# Patient Record
Sex: Male | Born: 1950
Health system: Southern US, Community
[De-identification: ages and names within clinical notes are randomized; demographics above are authoritative.]

## PROBLEM LIST (undated history)

## (undated) ENCOUNTER — Emergency Department (HOSPITAL_COMMUNITY): Payer: 59 | Source: Home / Self Care

## (undated) DIAGNOSIS — Z860100 Personal history of colon polyps, unspecified: Secondary | ICD-10-CM

## (undated) DIAGNOSIS — T7840XA Allergy, unspecified, initial encounter: Secondary | ICD-10-CM

## (undated) DIAGNOSIS — E785 Hyperlipidemia, unspecified: Secondary | ICD-10-CM

## (undated) DIAGNOSIS — I10 Essential (primary) hypertension: Secondary | ICD-10-CM

## (undated) HISTORY — PX: TONSILLECTOMY: SUR1361

## (undated) HISTORY — DX: Essential (primary) hypertension: I10

## (undated) HISTORY — PX: COLONOSCOPY: SHX174

## (undated) HISTORY — DX: Personal history of colon polyps, unspecified: Z86.0100

## (undated) HISTORY — DX: Hyperlipidemia, unspecified: E78.5

## (undated) HISTORY — PX: VASECTOMY: SHX75

## (undated) HISTORY — DX: Personal history of colonic polyps: Z86.010

## (undated) HISTORY — DX: Allergy, unspecified, initial encounter: T78.40XA

---

## 2004-06-08 ENCOUNTER — Ambulatory Visit: Payer: Self-pay | Admitting: Family Medicine

## 2005-06-14 ENCOUNTER — Ambulatory Visit: Payer: Self-pay | Admitting: Family Medicine

## 2005-06-21 ENCOUNTER — Ambulatory Visit: Payer: Self-pay | Admitting: Family Medicine

## 2006-02-02 ENCOUNTER — Ambulatory Visit: Payer: Self-pay | Admitting: Family Medicine

## 2006-07-27 ENCOUNTER — Ambulatory Visit: Payer: Self-pay | Admitting: Family Medicine

## 2006-07-27 LAB — CONVERTED CEMR LAB
ALT: 44 units/L — ABNORMAL HIGH (ref 0–40)
Albumin: 4.3 g/dL (ref 3.5–5.2)
Alkaline Phosphatase: 53 units/L (ref 39–117)
BUN: 7 mg/dL (ref 6–23)
Basophils Absolute: 0.1 10*3/uL (ref 0.0–0.1)
Basophils Relative: 0.9 % (ref 0.0–1.0)
CO2: 34 meq/L — ABNORMAL HIGH (ref 19–32)
Calcium: 9.1 mg/dL (ref 8.4–10.5)
Cholesterol: 168 mg/dL (ref 0–200)
GFR calc Af Amer: 130 mL/min
GFR calc non Af Amer: 107 mL/min
HDL: 33.6 mg/dL — ABNORMAL LOW (ref >39.0)
LDL Cholesterol: 104 mg/dL — ABNORMAL HIGH (ref 0–99)
Lymphocytes Relative: 22.8 % (ref 12.0–46.0)
MCHC: 34.8 g/dL (ref 30.0–36.0)
Monocytes Relative: 8.9 % (ref 3.0–11.0)
Neutro Abs: 4.9 10*3/uL (ref 1.4–7.7)
Platelets: 240 10*3/uL (ref 150–400)
Potassium: 4.2 meq/L (ref 3.5–5.1)
TSH: 2.12 microintl units/mL (ref 0.35–5.50)
Total Protein: 6.7 g/dL (ref 6.0–8.3)
Triglycerides: 150 mg/dL — ABNORMAL HIGH (ref 0–149)
VLDL: 30 mg/dL (ref 0–40)

## 2006-08-03 ENCOUNTER — Ambulatory Visit: Payer: Self-pay | Admitting: Family Medicine

## 2007-03-22 ENCOUNTER — Telehealth: Payer: Self-pay | Admitting: Family Medicine

## 2007-03-22 ENCOUNTER — Encounter: Payer: Self-pay | Admitting: Family Medicine

## 2007-12-04 ENCOUNTER — Telehealth: Payer: Self-pay | Admitting: Family Medicine

## 2008-01-03 ENCOUNTER — Ambulatory Visit: Payer: Self-pay | Admitting: Family Medicine

## 2008-01-03 LAB — CONVERTED CEMR LAB
AST: 27 units/L (ref 0–37)
Albumin: 4.4 g/dL (ref 3.5–5.2)
BUN: 8 mg/dL (ref 6–23)
Basophils Absolute: 0 10*3/uL (ref 0.0–0.1)
Basophils Relative: 0.4 % (ref 0.0–3.0)
Chloride: 105 meq/L (ref 96–112)
Cholesterol: 165 mg/dL (ref 0–200)
Creatinine, Ser: 1 mg/dL (ref 0.4–1.5)
Eosinophils Absolute: 0.2 10*3/uL (ref 0.0–0.7)
Eosinophils Relative: 2.9 % (ref 0.0–5.0)
GFR calc Af Amer: 100 mL/min
GFR calc non Af Amer: 82 mL/min
Glucose, Urine, Semiquant: NEGATIVE
HCT: 45 % (ref 39.0–52.0)
HDL: 37.9 mg/dL — ABNORMAL LOW (ref >39.0)
Ketones, urine, test strip: NEGATIVE
MCHC: 35.6 g/dL (ref 30.0–36.0)
MCV: 88.8 fL (ref 78.0–100.0)
Monocytes Absolute: 0.7 10*3/uL (ref 0.1–1.0)
Neutrophils Relative %: 62.8 % (ref 43.0–77.0)
PSA: 7.01 ng/mL — ABNORMAL HIGH (ref 0.10–4.00)
Platelets: 225 10*3/uL (ref 150–400)
Potassium: 3.4 meq/L — ABNORMAL LOW (ref 3.5–5.1)
Specific Gravity, Urine: 1.025
Total Bilirubin: 1 mg/dL (ref 0.3–1.2)
Triglycerides: 72 mg/dL (ref 0–149)
VLDL: 14 mg/dL (ref 0–40)
WBC: 7.8 10*3/uL (ref 4.5–10.5)
pH: 6

## 2008-01-09 ENCOUNTER — Encounter: Payer: Self-pay | Admitting: Family Medicine

## 2008-01-10 ENCOUNTER — Ambulatory Visit: Payer: Self-pay | Admitting: Family Medicine

## 2008-01-10 DIAGNOSIS — Z860100 Personal history of colon polyps, unspecified: Secondary | ICD-10-CM | POA: Insufficient documentation

## 2008-01-10 DIAGNOSIS — E785 Hyperlipidemia, unspecified: Secondary | ICD-10-CM

## 2008-01-18 ENCOUNTER — Ambulatory Visit: Payer: Self-pay | Admitting: Internal Medicine

## 2008-02-16 ENCOUNTER — Ambulatory Visit: Payer: Self-pay | Admitting: Family Medicine

## 2008-02-16 LAB — CONVERTED CEMR LAB
PSA, Free Pct: 19 — ABNORMAL LOW (ref >25)
PSA, Free: 0.4 ng/mL
PSA: 2.08 ng/mL (ref 0.10–4.00)

## 2008-02-17 ENCOUNTER — Telehealth: Payer: Self-pay | Admitting: Family Medicine

## 2008-06-27 ENCOUNTER — Ambulatory Visit: Payer: Self-pay | Admitting: Internal Medicine

## 2008-07-09 ENCOUNTER — Ambulatory Visit: Payer: Self-pay | Admitting: Internal Medicine

## 2008-07-09 LAB — HM COLONOSCOPY

## 2009-01-21 ENCOUNTER — Telehealth: Payer: Self-pay | Admitting: Family Medicine

## 2009-03-21 ENCOUNTER — Telehealth: Payer: Self-pay | Admitting: Family Medicine

## 2009-04-07 ENCOUNTER — Telehealth: Payer: Self-pay | Admitting: Family Medicine

## 2009-04-17 ENCOUNTER — Ambulatory Visit: Payer: Self-pay | Admitting: Family Medicine

## 2009-04-17 LAB — CONVERTED CEMR LAB
ALT: 28 units/L (ref 0–53)
Albumin: 4.4 g/dL (ref 3.5–5.2)
Alkaline Phosphatase: 55 units/L (ref 39–117)
Basophils Relative: 0.9 % (ref 0.0–3.0)
Bilirubin, Direct: 0.1 mg/dL (ref 0.0–0.3)
CO2: 31 meq/L (ref 19–32)
Chloride: 107 meq/L (ref 96–112)
Creatinine, Ser: 1.1 mg/dL (ref 0.4–1.5)
Eosinophils Relative: 2.6 % (ref 0.0–5.0)
HCT: 46.2 % (ref 39.0–52.0)
Hemoglobin: 14.9 g/dL (ref 13.0–17.0)
LDL Cholesterol: 98 mg/dL (ref 0–99)
MCV: 92.5 fL (ref 78.0–100.0)
Monocytes Absolute: 0.8 10*3/uL (ref 0.1–1.0)
Neutro Abs: 4.7 10*3/uL (ref 1.4–7.7)
Neutrophils Relative %: 64.3 % (ref 43.0–77.0)
Nitrite: NEGATIVE
PSA: 1.17 ng/mL (ref 0.10–4.00)
Potassium: 4.4 meq/L (ref 3.5–5.1)
RBC: 5 M/uL (ref 4.22–5.81)
Sodium: 142 meq/L (ref 135–145)
Specific Gravity, Urine: 1.02
Total CHOL/HDL Ratio: 3
Total Protein: 6.5 g/dL (ref 6.0–8.3)
Urobilinogen, UA: 0.2
WBC Urine, dipstick: NEGATIVE
WBC: 7.4 10*3/uL (ref 4.5–10.5)

## 2009-04-24 ENCOUNTER — Ambulatory Visit: Payer: Self-pay | Admitting: Family Medicine

## 2010-04-14 NOTE — Progress Notes (Signed)
Summary: refill  Phone Note Refill Request Message from:  Fax from Pharmacy  Refills Requested: Medication #1:  LIPITOR 20 MG TABS Take 1 tablet by mouth every night.   Last Refilled: 02/22/2009 [facility] ph----[phone]       fax-----[phone]  Initial call taken by: Warnell Forester,  March 21, 2009 8:48 AM    Prescriptions: LIPITOR 20 MG TABS (ATORVASTATIN CALCIUM) Take 1 tablet by mouth every night  #15 x 0   Entered by:   Kern Reap CMA (AAMA)   Authorized by:   Roderick Pee MD   Signed by:   Kern Reap CMA (AAMA) on 03/21/2009   Method used:   Electronically to        [facility]* (retail)       [address]       [place], [place]  [id number]       Ph: [phone]       Fax: [phone]   RxID:   [id number]

## 2010-04-14 NOTE — Progress Notes (Signed)
Summary: refill  Phone Note Refill Request Message from:  Fax from Pharmacy  Refills Requested: Medication #1:  LIPITOR 20 MG TABS Take 1 tablet by mouth every night. [place] [phone]    fax---[phone]  Initial call taken by: Warnell Forester,  April 07, 2009 9:52 AM  Follow-up for Phone Call        denied time for an office visit Follow-up by: Kern Reap CMA Duncan Dull),  April 07, 2009 11:48 AM  Additional Follow-up for Phone Call Additional follow up Details #1::        fax was sent back to pharmacy as denied-ov needed. Additional Follow-up by: Warnell Forester,  April 07, 2009 12:43 PM     Appended Document: refill Jason Gould,  please call Jason Gould tell him we will call him in 30 days of medication, but to make an appointment for a 30 minute physical sometime in the next 4 weeks because he is due.

## 2010-04-14 NOTE — Progress Notes (Signed)
Summary: lipitor refill  Phone Note Refill Request Message from:  Fax from Pharmacy on April 07, 2009 5:24 PM  Refills Requested: Medication #1:  LIPITOR 20 MG TABS Take 1 tablet by mouth every night. Initial call taken by: Kern Reap CMA Duncan Dull),  April 07, 2009 5:25 PM    Prescriptions: LIPITOR 20 MG TABS (ATORVASTATIN CALCIUM) Take 1 tablet by mouth every night  #30 x 0   Entered by:   Kern Reap CMA (AAMA)   Authorized by:   Roderick Pee MD   Signed by:   Kern Reap CMA (AAMA) on 04/07/2009   Method used:   Electronically to        [facility]* (retail)       [address]       [place], [place]  [id number]       Ph: [phone]       Fax: [phone]   RxID:   [id number]

## 2010-04-14 NOTE — Assessment & Plan Note (Signed)
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  He takes Lipitor 20 mg nightly total cholesterol 159, LDL 99, HDL 45.  LFTs normal.  Review of systems negative  Allergies (verified): No Known Drug Allergies  Past History:  Past medical, surgical, family and social histories (including risk factors) reviewed, and no changes noted (except as noted below).  Past Medical History: Reviewed history from 01/10/2008 and no changes required. Hyperlipidemia tonsillectomy concussion at age 48 years of age hospitalized.  No sequelae "V" Colonic polyps, hx of  Family History: Reviewed history from 01/10/2008 and no changes required.  father history of hypertension, hearing loss, hyperlipidemia mother history of colon cancer, hyperlipidemia two brothers, one in good health.  He has had colon resection for colon cancer two sisters in good health  Social History: Reviewed history from 02/16/2008 and no changes required. Occupation:  attn.for doctors !!!!!!!!!!!!!!!! Married Never Smoked Alcohol use-yes Drug use-no Regular exercise-yes  Physical Exam  General:  Well-developed,well-nourished,in no acute distress; alert,appropriate and cooperative throughout examination Head:  Normocephalic and atraumatic without obvious abnormalities. No apparent alopecia or balding. Eyes:  No corneal or conjunctival inflammation noted. EOMI. Perrla. Funduscopic exam benign, without hemorrhages, exudates or papilledema. Vision grossly normal. Ears:  External ear exam shows  no significant lesions or deformities.  Otoscopic examination reveals clear canals, tympanic membranes are intact bilaterally without bulging, retraction, inflammation or discharge. Hearing is grossly normal bilaterally. Nose:  External nasal examination shows no deformity or inflammation. Nasal mucosa are pink and moist without lesions or exudates. Mouth:  Oral mucosa and oropharynx without lesions or exudates.  Teeth in good repair. Neck:  No deformities, masses, or tenderness noted. Chest Wall:  No deformities, masses, tenderness or gynecomastia noted. Breasts:  No masses or gynecomastia noted Lungs:  Normal respiratory effort, chest expands symmetrically. Lungs are clear to auscultation, no crackles or wheezes. Heart:  Normal rate and regular rhythm. S1 and S2 normal without gallop, murmur, click, rub or other extra sounds. Abdomen:  Bowel sounds positive,abdomen soft and non-tender without masses, organomegaly or hernias noted. Rectal:  No external abnormalities noted. Normal sphincter tone. No rectal masses or tenderness. Genitalia:  Testes bilaterally descended without nodularity, tenderness or masses. No scrotal masses or lesions. No penis lesions or urethral discharge. Prostate:  Prostate gland firm and smooth, no enlargement, nodularity, tenderness, mass, asymmetry or induration. Msk:  No deformity or scoliosis noted of thoracic or lumbar spine.   Pulses:  R and L carotid,radial,femoral,dorsalis pedis and posterior tibial pulses are full and equal bilaterally Extremities:  No clubbing, cyanosis, edema, or deformity noted with normal full range of motion of all joints.   Neurologic:  No cranial nerve deficits noted. Station and gait are normal. Plantar reflexes are down-going bilaterally. DTRs are symmetrical throughout. Sensory, motor and coordinative functions appear intact. Skin:  Intact without suspicious lesions or rashes Cervical Nodes:  No lymphadenopathy noted Axillary Nodes:  No  palpable lymphadenopathy Inguinal Nodes:  No significant adenopathy Psych:  Cognition and judgment appear intact. Alert and cooperative with normal attention span and concentration. No apparent delusions, illusions, hallucinations   Impression & Recommendations:  Problem # 1:  HYPERLIPIDEMIA (ICD-272.4) Assessment Improved  His updated medication list for this problem includes:    Lipitor 20 Mg Tabs (Atorvastatin calcium) .Marland Kitchen... Take 1 tablet by mouth every night  Orders: Prescription Created Electronically [id number]) EKG w/ Interpretation (93000)  Problem # 2:  ELEVATED BP READING WITHOUT DX HYPERTENSION (ICD-796.2) Assessment: Unchanged  Orders: Prescription Created Electronically [id number]) EKG w/ Interpretation ([address])  Problem # 3:  COLONIC POLYPS, HX OF (ICD-V12.72) Assessment: Unchanged  Orders: Prescription Created Electronically [phone])  Complete Medication List: 1)  Lipitor 20 Mg Tabs (Atorvastatin calcium) .... Take 1 tablet by mouth every night  Patient Instructions: 1)  Please schedule a follow-up appointment in 1 year. 2)  Schedule a colonoscopy/sigmoidoscopy to help detect colon cancer. 3)  Take an Aspirin every day. Prescriptions: LIPITOR 20 MG TABS (ATORVASTATIN CALCIUM) Take 1 tablet by mouth every night  #100 x 3   Entered and Authorized by:   Roderick Pee MD   Signed by:   Roderick Pee MD on 04/24/2009   Method used:   Electronically to        [facility]* (retail)       [address]       [place], [place]  [id number]       Ph: [phone]       Fax: [phone]   RxID:   [id number] LIPITOR 20 MG TABS (ATORVASTATIN CALCIUM) Take 1 tablet by mouth every night  #100 x 3   Entered and Authorized by:   Roderick Pee MD   Signed by:   Roderick Pee MD on 04/24/2009   Method used:   Electronically to        [facility]* (retail)       [address]       [place], [place]  [id number]       Ph: [phone]        Fax: [phone]   RxID:   [id number]

## 2010-07-16 ENCOUNTER — Other Ambulatory Visit (INDEPENDENT_AMBULATORY_CARE_PROVIDER_SITE_OTHER): Payer: BC Managed Care – PPO

## 2010-07-16 DIAGNOSIS — Z0000 Encounter for general adult medical examination without abnormal findings: Secondary | ICD-10-CM

## 2010-07-16 LAB — POCT URINALYSIS DIPSTICK
Blood, UA: NEGATIVE
Glucose, UA: NEGATIVE
Nitrite, UA: NEGATIVE
Spec Grav, UA: 1.02
Urobilinogen, UA: 0.2
pH, UA: 6.5

## 2010-07-16 LAB — CBC WITH DIFFERENTIAL/PLATELET
Basophils Absolute: 0.1 10*3/uL (ref 0.0–0.1)
Eosinophils Absolute: 0.2 10*3/uL (ref 0.0–0.7)
Lymphocytes Relative: 24.5 % (ref 12.0–46.0)
MCHC: 34 g/dL (ref 30.0–36.0)
Neutrophils Relative %: 62.3 % (ref 43.0–77.0)
RDW: 13.5 % (ref 11.5–14.6)

## 2010-07-16 LAB — HEPATIC FUNCTION PANEL
Alkaline Phosphatase: 57 U/L (ref 39–117)
Bilirubin, Direct: 0.1 mg/dL (ref 0.0–0.3)

## 2010-07-16 LAB — BASIC METABOLIC PANEL
CO2: 26 mEq/L (ref 19–32)
Calcium: 8.8 mg/dL (ref 8.4–10.5)
Creatinine, Ser: 0.9 mg/dL (ref 0.4–1.5)
Glucose, Bld: 100 mg/dL — ABNORMAL HIGH (ref 70–99)

## 2010-07-16 LAB — LIPID PANEL
HDL: 40.4 mg/dL (ref >39.00)
Total CHOL/HDL Ratio: 4
Triglycerides: 84 mg/dL (ref 0.0–149.0)
VLDL: 16.8 mg/dL (ref 0.0–40.0)

## 2010-07-16 LAB — TSH: TSH: 1.96 u[IU]/mL (ref 0.35–5.50)

## 2010-07-17 ENCOUNTER — Other Ambulatory Visit: Payer: Self-pay | Admitting: Family Medicine

## 2010-07-21 ENCOUNTER — Encounter: Payer: Self-pay | Admitting: Family Medicine

## 2010-07-22 ENCOUNTER — Encounter: Payer: Self-pay | Admitting: Family Medicine

## 2010-07-22 ENCOUNTER — Ambulatory Visit (INDEPENDENT_AMBULATORY_CARE_PROVIDER_SITE_OTHER): Payer: BC Managed Care – PPO | Admitting: Family Medicine

## 2010-07-22 VITALS — BP 130/90 | Temp 98.3°F | Ht 72.0 in | Wt 203.0 lb

## 2010-07-22 DIAGNOSIS — E785 Hyperlipidemia, unspecified: Secondary | ICD-10-CM

## 2010-07-22 MED ORDER — ATORVASTATIN CALCIUM 20 MG PO TABS: 20.0000 mg | ORAL_TABLET | Freq: Every day | ORAL | Status: DC

## 2010-07-22 NOTE — Patient Instructions (Signed)
Continue your current medications.  I would recommend he see a plastic surgeon for removal of the lesion.  On your left upper 4 head, since it's increasing in size.  Return one year or sooner if any problem

## 2010-07-22 NOTE — Progress Notes (Signed)
  Subjective:    Patient ID: Jason Gould, male    DOB: May 01, 1950, 60 y.o.   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  He takes Lipitor 20 mg nightly along with a baby aspirin, Lipitor, goal with a total cholesterol of 167, HDL 40, and LDL of 110.  He also takes a baby aspirin.  He also has hearing loss.  He does have bilateral hearing aids.  He does have numerous seborrheic keratoses.  The one on his left upper forehead is increasing in size.  Wife Olegario Messier is doing well.  Children are well.  A daughter in [place], [place], is a Teacher, early years/pre, and a son, who recently graduated from college   Review of Systems  Constitutional: Negative.   HENT: Positive for hearing loss.   Eyes: Negative.   Respiratory: Negative.   Cardiovascular: Negative.   Gastrointestinal: Negative.   Genitourinary: Negative.   Musculoskeletal: Negative.   Skin: Negative.   Neurological: Negative.   Hematological: Negative.   Psychiatric/Behavioral: Negative.        Objective:   Physical Exam  Constitutional: He is oriented to person, place, and time. He appears well-developed and well-nourished.  HENT:  Head: Normocephalic and atraumatic.  Right Ear: External ear normal.  Left Ear: External ear normal.  Nose: Nose normal.  Mouth/Throat: Oropharynx is clear and moist.  Eyes: Conjunctivae and EOM are normal. Pupils are equal, round, and reactive to light.  Neck: Normal range of motion. Neck supple. No JVD present. No tracheal deviation present. No thyromegaly present.  Cardiovascular: Normal rate, regular rhythm, normal heart sounds and intact distal pulses.  Exam reveals no gallop and no friction rub.   No murmur heard. Pulmonary/Chest: Effort normal and breath sounds normal. No stridor. No respiratory distress. He has no wheezes. He has no rales. He exhibits no  tenderness.  Abdominal: Soft. Bowel sounds are normal. He exhibits no distension and no mass. There is no tenderness. There is no rebound and no guarding.  Genitourinary: Rectum normal, prostate normal and penis normal. Guaiac negative stool. No penile tenderness.  Musculoskeletal: Normal range of motion. He exhibits no edema and no tenderness.  Lymphadenopathy:    He has no cervical adenopathy.  Neurological: He is alert and oriented to person, place, and time. He has normal reflexes. No cranial nerve deficit. He exhibits normal muscle tone.  Skin: Skin is warm and dry. No rash noted. No erythema. No pallor.       Total body skin exam shows numerous freckles and moles.  Capillary hemangiomas seborrheic keratoses.  The one on his left forehead is increasing in size.  I would recommend that be removed.  He also has multiple skin tags around his upper and lower eyelids.  I would also recommend that these be removed.  Psychiatric: He has a normal mood and affect. His behavior is normal. Judgment and thought content normal.          Assessment & Plan:  Healthy male.  Hyperlipidemia,,,,,,,,,,, lipid signal continue current therapy.  Hearing loss.  Continue hearing aids.  Seborrheic keratoses left forehead, increasing in size.  Recommend plastic surgery consult for removal

## 2011-07-27 ENCOUNTER — Other Ambulatory Visit: Payer: Self-pay | Admitting: *Deleted

## 2011-07-27 DIAGNOSIS — E785 Hyperlipidemia, unspecified: Secondary | ICD-10-CM

## 2011-07-27 MED ORDER — ATORVASTATIN CALCIUM 20 MG PO TABS: 20.0000 mg | ORAL_TABLET | Freq: Every day | ORAL | Status: DC

## 2011-08-17 ENCOUNTER — Telehealth: Payer: Self-pay | Admitting: Family Medicine

## 2011-08-17 DIAGNOSIS — Z0000 Encounter for general adult medical examination without abnormal findings: Secondary | ICD-10-CM

## 2011-08-17 DIAGNOSIS — R030 Elevated blood-pressure reading, without diagnosis of hypertension: Secondary | ICD-10-CM

## 2011-08-17 DIAGNOSIS — E785 Hyperlipidemia, unspecified: Secondary | ICD-10-CM

## 2011-08-17 NOTE — Telephone Encounter (Signed)
Pt is going to [facility] 6.11 for cpx labs. Can you please put the orders in? Thanks.

## 2011-08-17 NOTE — Telephone Encounter (Signed)
Order sent.

## 2011-08-24 ENCOUNTER — Other Ambulatory Visit: Payer: BC Managed Care – PPO

## 2011-08-25 ENCOUNTER — Other Ambulatory Visit (INDEPENDENT_AMBULATORY_CARE_PROVIDER_SITE_OTHER): Payer: BC Managed Care – PPO

## 2011-08-25 DIAGNOSIS — R030 Elevated blood-pressure reading, without diagnosis of hypertension: Secondary | ICD-10-CM

## 2011-08-25 DIAGNOSIS — Z0000 Encounter for general adult medical examination without abnormal findings: Secondary | ICD-10-CM

## 2011-08-25 DIAGNOSIS — E785 Hyperlipidemia, unspecified: Secondary | ICD-10-CM

## 2011-08-25 LAB — LIPID PANEL
Cholesterol: 153 mg/dL (ref 0–200)
HDL: 39.9 mg/dL (ref >39.00)
LDL Cholesterol: 100 mg/dL — ABNORMAL HIGH (ref 0–99)
Triglycerides: 64 mg/dL (ref 0.0–149.0)
VLDL: 12.8 mg/dL (ref 0.0–40.0)

## 2011-08-25 LAB — HEPATIC FUNCTION PANEL
ALT: 29 U/L (ref 0–53)
AST: 25 U/L (ref 0–37)
Total Protein: 6.1 g/dL (ref 6.0–8.3)

## 2011-08-25 LAB — URINALYSIS
Bilirubin Urine: NEGATIVE
Ketones, ur: NEGATIVE
Urine Glucose: NEGATIVE
Urobilinogen, UA: 0.2 (ref 0.0–1.0)

## 2011-08-25 LAB — CBC WITH DIFFERENTIAL/PLATELET
Basophils Absolute: 0.1 10*3/uL (ref 0.0–0.1)
Eosinophils Absolute: 0.2 10*3/uL (ref 0.0–0.7)
Lymphocytes Relative: 19.9 % (ref 12.0–46.0)
Lymphs Abs: 1.6 10*3/uL (ref 0.7–4.0)
Monocytes Relative: 8.4 % (ref 3.0–12.0)
Platelets: 231 10*3/uL (ref 150.0–400.0)
RDW: 13 % (ref 11.5–14.6)

## 2011-08-25 LAB — BASIC METABOLIC PANEL
BUN: 11 mg/dL (ref 6–23)
Chloride: 103 mEq/L (ref 96–112)
Potassium: 3.9 mEq/L (ref 3.5–5.1)
Sodium: 140 mEq/L (ref 135–145)

## 2011-08-25 LAB — TSH: TSH: 1.74 u[IU]/mL (ref 0.35–5.50)

## 2011-08-25 LAB — PSA: PSA: 1.75 ng/mL (ref 0.10–4.00)

## 2011-08-26 ENCOUNTER — Encounter: Payer: Self-pay | Admitting: Family Medicine

## 2011-08-30 ENCOUNTER — Ambulatory Visit (INDEPENDENT_AMBULATORY_CARE_PROVIDER_SITE_OTHER): Payer: BC Managed Care – PPO | Admitting: Family Medicine

## 2011-08-30 ENCOUNTER — Encounter: Payer: Self-pay | Admitting: Family Medicine

## 2011-08-30 VITALS — BP 110/80 | Temp 98.3°F | Ht 73.5 in | Wt 206.0 lb

## 2011-08-30 DIAGNOSIS — E785 Hyperlipidemia, unspecified: Secondary | ICD-10-CM

## 2011-08-30 DIAGNOSIS — Z0000 Encounter for general adult medical examination without abnormal findings: Secondary | ICD-10-CM

## 2011-08-30 MED ORDER — ATORVASTATIN CALCIUM 20 MG PO TABS: 20.0000 mg | ORAL_TABLET | Freq: Every day | ORAL | Status: DC

## 2011-08-30 NOTE — Patient Instructions (Addendum)
Continue your current medications  Followup in 1 year sooner if any problems  GI we'll contact you when it's time for followup colonoscopy  Remember also taken aspirin tablet daily if you're not already

## 2011-08-30 NOTE — Progress Notes (Signed)
  Subjective:    Patient ID: Jason Gould, male    DOB: [birth year], 61 y.o.   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   HENT: Negative.   Eyes: Negative.   Respiratory: Negative.   Cardiovascular: Negative.   Gastrointestinal: Negative.   Genitourinary: Negative.   Musculoskeletal: Negative.   Skin: Negative.   Neurological: Negative.   Hematological: Negative.   Psychiatric/Behavioral: Negative.        Objective:   Physical Exam  Constitutional: He is oriented to person, place, and time. He appears well-developed and well-nourished.  HENT:  Head: Normocephalic and atraumatic.  Right Ear: External ear normal.  Left Ear: External ear normal.  Nose: Nose normal.  Mouth/Throat: Oropharynx is clear and moist.  Eyes: Conjunctivae and EOM are normal. Pupils are equal, round, and reactive to light.  Neck: Normal range of motion. Neck supple. No JVD present. No tracheal deviation present. No thyromegaly present.  Cardiovascular: Normal rate, regular rhythm, normal heart sounds and intact distal pulses.  Exam reveals no gallop and no friction rub.   No murmur heard. Pulmonary/Chest: Effort normal and breath sounds normal. No stridor. No respiratory distress. He has no wheezes. He has no rales. He exhibits no tenderness.  Abdominal: Soft. Bowel sounds are normal. He exhibits no distension and no mass. There is no tenderness. There is no rebound and no guarding.  Genitourinary: Rectum normal, prostate normal and penis normal. Guaiac negative stool. No penile tenderness.  Musculoskeletal: Normal range of motion. He exhibits no edema and no tenderness.  Lymphadenopathy:    He has no cervical adenopathy.  Neurological: He is  alert and oriented to person, place, and time. He has normal reflexes. No cranial nerve deficit. He exhibits normal muscle tone.  Skin: Skin is warm and dry. No rash noted. No erythema. No pallor.  Psychiatric: He has a normal mood and affect. His behavior is normal. Judgment and thought content normal.          Assessment & Plan:  Healthy male  Hyperlipidemia continue Lipitor 20 mg daily and an aspirin tablet  History of colon polyps followup in GI as outlined

## 2012-05-09 ENCOUNTER — Ambulatory Visit (INDEPENDENT_AMBULATORY_CARE_PROVIDER_SITE_OTHER): Payer: BC Managed Care – PPO | Admitting: Family Medicine

## 2012-05-09 ENCOUNTER — Encounter: Payer: Self-pay | Admitting: Family Medicine

## 2012-05-09 VITALS — BP 150/90 | Temp 98.6°F | Wt 208.0 lb

## 2012-05-09 DIAGNOSIS — I10 Essential (primary) hypertension: Secondary | ICD-10-CM

## 2012-05-09 LAB — POCT URINALYSIS DIPSTICK
Bilirubin, UA: NEGATIVE
Glucose, UA: NEGATIVE
Leukocytes, UA: NEGATIVE
Nitrite, UA: NEGATIVE
Urobilinogen, UA: 0.2

## 2012-05-09 MED ORDER — ATENOLOL-CHLORTHALIDONE 50-25 MG PO TABS: 1.0000 | ORAL_TABLET | Freq: Every day | ORAL | Status: DC

## 2012-05-09 NOTE — Patient Instructions (Signed)
20 minutes of exercise daily  Salt free diet  Tenoretic 50-25,,,,,,,, we'll start with a half a tablet a day in the morning  Check your blood pressure daily in the morning,,,,,,, the metastatic risk device I seen is the digital pump up blood pressure cuff ..........     [employer]  Return in one month for followup with a record of volume blood pressure readings and the device  Basic labs today

## 2012-05-09 NOTE — Progress Notes (Signed)
  Subjective:    Patient ID: Jason Gould, male    DOB: [birth year], 62 y.o.   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 For the past 5 nights he's taking 100 mg of Toprol at bedtime which his wife had. BP today 150/90.  It significant that both his mother and his father have a history of hypertension mother set a pacemaker for bradycardia  2 sisters both in good health 2 brothers in good health once a rheumatologist in [place] none of his siblings have hypertension that he knows of.   Review of Systems Review of systems otherwise negative    Objective:   Physical Exam  Well-developed well-nourished male no acute distress BP right arm sitting position 150/100 same left pulse 70 and regular  Cardiac exam normal pulmonary exam normal no carotid or abdominal bruits      Assessment & Plan:  New-onset hypertension plan,,,,,,,,, exercise program, salt free diet, Tenoretic one half tab daily BP check daily followup in 4-6 weeks labs today

## 2012-05-10 LAB — BASIC METABOLIC PANEL
Chloride: 107 mEq/L (ref 96–112)
GFR: 63.77 mL/min (ref >60.00)
Potassium: 3.8 mEq/L (ref 3.5–5.1)
Sodium: 141 mEq/L (ref 135–145)

## 2012-05-10 LAB — CBC WITH DIFFERENTIAL/PLATELET
Basophils Absolute: 0.1 10*3/uL (ref 0.0–0.1)
Hemoglobin: 14.8 g/dL (ref 13.0–17.0)
Lymphocytes Relative: 23.8 % (ref 12.0–46.0)
Monocytes Relative: 7.7 % (ref 3.0–12.0)
Neutro Abs: 5.4 10*3/uL (ref 1.4–7.7)
Neutrophils Relative %: 64.8 % (ref 43.0–77.0)
RBC: 4.97 Mil/uL (ref 4.22–5.81)
RDW: 13.2 % (ref 11.5–14.6)

## 2012-06-06 ENCOUNTER — Ambulatory Visit: Payer: BC Managed Care – PPO | Admitting: Family Medicine

## 2012-07-11 ENCOUNTER — Ambulatory Visit (INDEPENDENT_AMBULATORY_CARE_PROVIDER_SITE_OTHER): Payer: BC Managed Care – PPO | Admitting: Family Medicine

## 2012-07-11 ENCOUNTER — Encounter: Payer: Self-pay | Admitting: Family Medicine

## 2012-07-11 VITALS — BP 140/80 | Temp 98.3°F | Wt 202.0 lb

## 2012-07-11 DIAGNOSIS — I10 Essential (primary) hypertension: Secondary | ICD-10-CM

## 2012-07-11 MED ORDER — CHLORTHALIDONE 25 MG PO TABS: 25.0000 mg | ORAL_TABLET | Freq: Every day | ORAL | Status: DC

## 2012-07-11 NOTE — Progress Notes (Signed)
  Subjective:    Patient ID: KEKOA FYOCK, male    DOB: 1951-02-23, 62 y.o.   MRN: [id number]  HPI Melecio is a 62 year old married male nonsmoker who comes in today for followup of hypertension  We saw him a while back and start him on Tenoretic 50-25 however he didn't take his medication. He wanted to try diet and exercise and see if that will help. His weight is down to 202 pounds his blood pressure still runs high. Systolic range from 135-150 diastolic range from 85-90.  BP right arm sitting position with our cuff and his today by me BP 150/90   Review of Systems    review of systems negative Objective:   Physical Exam  Well-developed well-nourished male no acute distress BP right arm sitting position 150/90      Assessment & Plan:  Hypertension not at goal begin chlorthalidone 12.5 mg daily followup in one month

## 2012-07-11 NOTE — Patient Instructions (Signed)
Chlorthalidone 25 mg............. One tablet daily in the morning  Check your blood pressure right arm sitting position daily in the morning  Return in one month sooner if any problems

## 2012-08-25 ENCOUNTER — Other Ambulatory Visit: Payer: BC Managed Care – PPO

## 2012-08-30 ENCOUNTER — Encounter: Payer: BC Managed Care – PPO | Admitting: Family Medicine

## 2012-08-31 ENCOUNTER — Other Ambulatory Visit (INDEPENDENT_AMBULATORY_CARE_PROVIDER_SITE_OTHER): Payer: BC Managed Care – PPO

## 2012-08-31 DIAGNOSIS — Z0000 Encounter for general adult medical examination without abnormal findings: Secondary | ICD-10-CM

## 2012-08-31 LAB — BASIC METABOLIC PANEL
CO2: 30 mEq/L (ref 19–32)
Chloride: 103 mEq/L (ref 96–112)
Glucose, Bld: 108 mg/dL — ABNORMAL HIGH (ref 70–99)
Potassium: 4 mEq/L (ref 3.5–5.1)
Sodium: 140 mEq/L (ref 135–145)

## 2012-08-31 LAB — POCT URINALYSIS DIPSTICK
Glucose, UA: NEGATIVE
Leukocytes, UA: NEGATIVE
Spec Grav, UA: 1.015
Urobilinogen, UA: 0.2

## 2012-08-31 LAB — CBC WITH DIFFERENTIAL/PLATELET
Basophils Absolute: 0.1 10*3/uL (ref 0.0–0.1)
Basophils Relative: 0.8 % (ref 0.0–3.0)
Eosinophils Absolute: 0.2 10*3/uL (ref 0.0–0.7)
HCT: 40.7 % (ref 39.0–52.0)
Hemoglobin: 13.7 g/dL (ref 13.0–17.0)
Lymphocytes Relative: 10.3 % — ABNORMAL LOW (ref 12.0–46.0)
Lymphs Abs: 1.1 10*3/uL (ref 0.7–4.0)
MCHC: 33.7 g/dL (ref 30.0–36.0)
Neutro Abs: 8.4 10*3/uL — ABNORMAL HIGH (ref 1.4–7.7)
RBC: 4.45 Mil/uL (ref 4.22–5.81)
RDW: 13.2 % (ref 11.5–14.6)

## 2012-08-31 LAB — HEPATIC FUNCTION PANEL
AST: 23 U/L (ref 0–37)
Alkaline Phosphatase: 59 U/L (ref 39–117)
Total Bilirubin: 0.9 mg/dL (ref 0.3–1.2)

## 2012-08-31 LAB — LIPID PANEL
Cholesterol: 165 mg/dL (ref 0–200)
LDL Cholesterol: 101 mg/dL — ABNORMAL HIGH (ref 0–99)
Triglycerides: 124 mg/dL (ref 0.0–149.0)
VLDL: 24.8 mg/dL (ref 0.0–40.0)

## 2012-09-05 ENCOUNTER — Ambulatory Visit (INDEPENDENT_AMBULATORY_CARE_PROVIDER_SITE_OTHER): Payer: BC Managed Care – PPO | Admitting: Family Medicine

## 2012-09-05 ENCOUNTER — Encounter: Payer: Self-pay | Admitting: Family Medicine

## 2012-09-05 VITALS — BP 120/74 | Temp 99.0°F | Ht 72.0 in | Wt 204.0 lb

## 2012-09-05 DIAGNOSIS — Z86010 Personal history of colonic polyps: Secondary | ICD-10-CM

## 2012-09-05 DIAGNOSIS — E785 Hyperlipidemia, unspecified: Secondary | ICD-10-CM

## 2012-09-05 DIAGNOSIS — I10 Essential (primary) hypertension: Secondary | ICD-10-CM

## 2012-09-05 MED ORDER — METOPROLOL SUCCINATE ER 50 MG PO TB24: 50.0000 mg | ORAL_TABLET | Freq: Every day | ORAL | Status: DC

## 2012-09-05 MED ORDER — CHLORTHALIDONE 25 MG PO TABS: 25.0000 mg | ORAL_TABLET | Freq: Every day | ORAL | Status: DC

## 2012-09-05 NOTE — Progress Notes (Signed)
Subjective:    Patient ID: Jason Gould, male    DOB: [birth year], 62 y.o.   MRN: [id number]  HPI [name] is a 62 year old married male nonsmoker who comes in today for his annual physical examination because of a history of hypertension and hyperlipidemia  He currently takes Lipitor 20 mg daily for hyperlipidemia lipids are at goal with an LDL of 101  He also takes chlorthalidone 25 mg daily for hypertension. I had originally started him on a beta blocker Tenoretic however he wanted to see if the chlorthalidone alone would keep his blood pressure normal. It didn't so he took some of his wife's Toprol 50 mg and that combination has kept his blood pressure normal BP today 120/74. No side effects from the Toprol  He has had some soreness in his great toe but no episodes of severe pain redness or swelling. He may have a little gout and that toe. We discussed checking uric acid level since starting allopurinol we decided to hold off for now. The diuretic may precipitate a gout attack and he is aware of that  He gets routine eye care, dental care, colonoscopy is in GI because of a history colon polyps, tetanus booster 2009, also has bilateral hearing aids for high-frequency hearing loss   Review of Systems  Constitutional: Negative.   HENT: Negative.   Eyes: Negative.   Respiratory: Negative.   Cardiovascular: Negative.   Gastrointestinal: Negative.   Genitourinary: Negative.   Musculoskeletal: Negative.   Skin: Negative.   Neurological: Negative.   Psychiatric/Behavioral: Negative.        Objective:   Physical Exam  Nursing note and vitals reviewed. Constitutional: He is oriented to person, place, and time. He appears well-developed and well-nourished.  HENT:  Head: Normocephalic and atraumatic.  Right Ear: External ear normal.  Left Ear: External ear normal.  Nose: Nose normal.  Mouth/Throat: Oropharynx is clear and moist.  Eyes: Conjunctivae and EOM are normal. Pupils are equal,  round, and reactive to light.  Neck: Normal range of motion. Neck supple. No JVD present. No tracheal deviation present. No thyromegaly present.  Cardiovascular: Normal rate, regular rhythm, normal heart sounds and intact distal pulses.  Exam reveals no gallop and no friction rub.   No murmur heard. No carotid or aortic bruits peripheral pulses 2+ and symmetrical  Pulmonary/Chest: Effort normal and breath sounds normal. No stridor. No respiratory distress. He has no wheezes. He has no rales. He exhibits no tenderness.  Abdominal: Soft. Bowel sounds are normal. He exhibits no distension and no mass. There is no tenderness. There is no rebound and no guarding.  Genitourinary: Rectum normal, prostate normal and penis normal. Guaiac negative stool. No penile tenderness.  Musculoskeletal: Normal range of motion. He exhibits no edema and no tenderness.  Lymphadenopathy:    He has no cervical adenopathy.  Neurological: He is alert and oriented to person, place, and time. He has normal reflexes. No cranial nerve deficit. He exhibits normal muscle tone.  Skin: Skin is warm and dry. No rash noted. No erythema. No pallor.  Total body skin exam normal he has a garden variety of freckles moles capillaries meningioma skin tags seborrheic keratosis all of which appear to be benign  Psychiatric: He has a normal mood and affect. His behavior is normal. Judgment and thought content normal.          Assessment & Plan:  Healthy male  Hyperlipidemia goal continue Lipitor 20 mg daily  Hypertension at goal on chlorthalidone  25 mg and Toprol 50 mg daily.

## 2012-09-05 NOTE — Patient Instructions (Addendum)
Continue the chlorthalidone and Toprol............. Once your blood pressure is stable you might want to try to cut the Toprol in half  If you do that and be sure to do a BP check every morning for 3-4 weeks to be sure your blood pressure stays normal,,,,,,,,,,,,, 135/85 or less  Return in one year for general physical exam sooner if any problems  Continue the Lipitor and aspirin one of each daily

## 2012-11-05 ENCOUNTER — Other Ambulatory Visit: Payer: Self-pay | Admitting: Family Medicine

## 2013-05-31 ENCOUNTER — Encounter: Payer: Self-pay | Admitting: Internal Medicine

## 2013-10-15 ENCOUNTER — Other Ambulatory Visit: Payer: Self-pay | Admitting: Family Medicine

## 2013-11-17 ENCOUNTER — Other Ambulatory Visit: Payer: Self-pay | Admitting: Family Medicine

## 2013-11-26 ENCOUNTER — Other Ambulatory Visit: Payer: Self-pay | Admitting: Family Medicine

## 2013-11-28 ENCOUNTER — Telehealth: Payer: Self-pay | Admitting: Family Medicine

## 2013-11-28 DIAGNOSIS — I10 Essential (primary) hypertension: Secondary | ICD-10-CM

## 2013-11-28 MED ORDER — CHLORTHALIDONE 25 MG PO TABS: 25.0000 mg | ORAL_TABLET | Freq: Every day | ORAL | Status: DC

## 2013-11-28 NOTE — Telephone Encounter (Signed)
Pt has cpx sch for 12/13/13 and needs refill on chlorthalidone call into [facility]

## 2013-12-06 ENCOUNTER — Other Ambulatory Visit (INDEPENDENT_AMBULATORY_CARE_PROVIDER_SITE_OTHER): Payer: BC Managed Care – PPO

## 2013-12-06 ENCOUNTER — Other Ambulatory Visit: Payer: BC Managed Care – PPO

## 2013-12-06 DIAGNOSIS — Z0000 Encounter for general adult medical examination without abnormal findings: Secondary | ICD-10-CM

## 2013-12-06 LAB — CBC WITH DIFFERENTIAL/PLATELET
BASOS ABS: 0.1 10*3/uL (ref 0.0–0.1)
Basophils Relative: 1.3 % (ref 0.0–3.0)
EOS ABS: 0.2 10*3/uL (ref 0.0–0.7)
Eosinophils Relative: 2 % (ref 0.0–5.0)
HEMATOCRIT: 46.9 % (ref 39.0–52.0)
Hemoglobin: 15.8 g/dL (ref 13.0–17.0)
LYMPHS ABS: 1.9 10*3/uL (ref 0.7–4.0)
Lymphocytes Relative: 23.8 % (ref 12.0–46.0)
MCHC: 33.7 g/dL (ref 30.0–36.0)
MCV: 91.6 fl (ref 78.0–100.0)
Monocytes Absolute: 0.8 10*3/uL (ref 0.1–1.0)
Monocytes Relative: 10.2 % (ref 3.0–12.0)
Neutro Abs: 5.1 10*3/uL (ref 1.4–7.7)
Neutrophils Relative %: 62.7 % (ref 43.0–77.0)
PLATELETS: 237 10*3/uL (ref 150.0–400.0)
RBC: 5.12 Mil/uL (ref 4.22–5.81)
RDW: 13.2 % (ref 11.5–15.5)
WBC: 8.1 10*3/uL (ref 4.0–10.5)

## 2013-12-06 LAB — POCT URINALYSIS DIPSTICK
Bilirubin, UA: NEGATIVE
Blood, UA: NEGATIVE
Glucose, UA: NEGATIVE
Ketones, UA: NEGATIVE
Leukocytes, UA: NEGATIVE
NITRITE UA: NEGATIVE
PH UA: 7.5
Protein, UA: NEGATIVE
Spec Grav, UA: 1.01
Urobilinogen, UA: 0.2

## 2013-12-06 LAB — HEPATIC FUNCTION PANEL
ALT: 30 U/L (ref 0–53)
AST: 28 U/L (ref 0–37)
Albumin: 4.4 g/dL (ref 3.5–5.2)
Alkaline Phosphatase: 50 U/L (ref 39–117)
Bilirubin, Direct: 0.1 mg/dL (ref 0.0–0.3)
Total Bilirubin: 1.1 mg/dL (ref 0.2–1.2)
Total Protein: 6.7 g/dL (ref 6.0–8.3)

## 2013-12-06 LAB — LIPID PANEL
CHOLESTEROL: 179 mg/dL (ref 0–200)
HDL: 37.1 mg/dL — AB (ref >39.00)
LDL Cholesterol: 116 mg/dL — ABNORMAL HIGH (ref 0–99)
NonHDL: 141.9
TRIGLYCERIDES: 131 mg/dL (ref 0.0–149.0)
Total CHOL/HDL Ratio: 5
VLDL: 26.2 mg/dL (ref 0.0–40.0)

## 2013-12-06 LAB — BASIC METABOLIC PANEL
BUN: 14 mg/dL (ref 6–23)
CO2: 30 mEq/L (ref 19–32)
CREATININE: 1.1 mg/dL (ref 0.4–1.5)
Calcium: 9.6 mg/dL (ref 8.4–10.5)
Chloride: 100 mEq/L (ref 96–112)
GFR: 69.96 mL/min (ref >60.00)
Glucose, Bld: 110 mg/dL — ABNORMAL HIGH (ref 70–99)
POTASSIUM: 4.2 meq/L (ref 3.5–5.1)
Sodium: 139 mEq/L (ref 135–145)

## 2013-12-06 LAB — PSA: PSA: 2.11 ng/mL (ref 0.10–4.00)

## 2013-12-06 LAB — TSH: TSH: 2.1 u[IU]/mL (ref 0.35–4.50)

## 2013-12-07 ENCOUNTER — Other Ambulatory Visit: Payer: BC Managed Care – PPO

## 2013-12-13 ENCOUNTER — Encounter: Payer: Self-pay | Admitting: Family Medicine

## 2013-12-13 ENCOUNTER — Ambulatory Visit (INDEPENDENT_AMBULATORY_CARE_PROVIDER_SITE_OTHER): Payer: BC Managed Care – PPO | Admitting: Family Medicine

## 2013-12-13 VITALS — BP 110/72 | Temp 98.2°F | Ht 72.25 in | Wt 207.0 lb

## 2013-12-13 DIAGNOSIS — I10 Essential (primary) hypertension: Secondary | ICD-10-CM

## 2013-12-13 DIAGNOSIS — Z23 Encounter for immunization: Secondary | ICD-10-CM

## 2013-12-13 DIAGNOSIS — E785 Hyperlipidemia, unspecified: Secondary | ICD-10-CM

## 2013-12-13 DIAGNOSIS — Z0000 Encounter for general adult medical examination without abnormal findings: Secondary | ICD-10-CM

## 2013-12-13 MED ORDER — ATORVASTATIN CALCIUM 20 MG PO TABS: ORAL_TABLET | ORAL | Status: DC

## 2013-12-13 MED ORDER — CHLORTHALIDONE 25 MG PO TABS: ORAL_TABLET | ORAL | Status: DC

## 2013-12-13 MED ORDER — METOPROLOL SUCCINATE ER 50 MG PO TB24: ORAL_TABLET | ORAL | Status: DC

## 2013-12-13 NOTE — Progress Notes (Signed)
Pre visit review using our clinic review tool, if applicable. No additional management support is needed unless otherwise documented below in the visit note. 

## 2013-12-13 NOTE — Progress Notes (Signed)
   Subjective:    Patient ID: Jason Gould, male    DOB: 09/18/1950, 63 y.o.   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   HENT: Negative.   Eyes: Negative.   Respiratory: Negative.   Cardiovascular: Negative.   Gastrointestinal: Negative.   Genitourinary: Negative.   Musculoskeletal: Negative.   Skin: Negative.   Neurological: Negative.   Psychiatric/Behavioral: Negative.        Objective:   Physical Exam  Constitutional: He is oriented to person, place, and time. He appears well-developed and well-nourished.  HENT:  Head: Normocephalic and atraumatic.  Right Ear: External ear normal.  Left Ear: External ear normal.  Nose: Nose normal.  Mouth/Throat: Oropharynx is clear and moist.  Bilateral hearing loss........ he does wear his hearing aids but only sporadically  Eyes: Conjunctivae and EOM are normal. Pupils are equal, round, and reactive to light.  Neck: Normal range of motion. Neck supple. No JVD present. No tracheal deviation present. No thyromegaly present.  Cardiovascular: Normal rate, regular rhythm, normal heart sounds and intact distal pulses.  Exam reveals no gallop and no friction rub.   No murmur heard. Bruce pulses 2+ and symmetrical  Pulmonary/Chest: Effort normal and breath sounds normal. No stridor. No respiratory distress. He has no wheezes. He has no rales. He exhibits no tenderness.  Abdominal: Soft.  Bowel sounds are normal. He exhibits no distension and no mass. There is no tenderness. There is no rebound and no guarding.  Genitourinary: Rectum normal, prostate normal and penis normal. Guaiac negative stool. No penile tenderness.  Musculoskeletal: Normal range of motion. He exhibits no edema and no tenderness.  Lymphadenopathy:    He has no cervical adenopathy.  Neurological: He is alert and oriented to person, place, and time. He has normal reflexes. No cranial nerve deficit. He exhibits normal muscle tone.  Skin: Skin is warm and dry. No rash noted. No erythema. No pallor.  Total body skin exam normal except for scar mid upper abdomen midline previous mole removed  Psychiatric: He has a normal mood and affect. His behavior is normal. Judgment and thought content normal.   Multiple skin tags upper and lower eyelids increasing in size..........Marland Kitchen. referred to Dr. Hazle Quantigby ophthalmologist      Assessment & Plan:  Healthy male  Hypertension at goal continue current therapy  Hyperlipidemia continue Lipitor  Multiple skin tags upper and lower eyelids increasing in size.........Marland Kitchen. refer to ophthalmology to consider removal

## 2013-12-13 NOTE — Patient Instructions (Signed)
Toprol 50 mg............. one half tab every morning  Hydracon 25 mg.........Marland Kitchen. 1 tab every morning  Lipitor 20 mg........Marland Kitchen. 1 tab every morning  Followup in 1 year sooner if any problems

## 2013-12-14 ENCOUNTER — Telehealth: Payer: Self-pay | Admitting: Family Medicine

## 2013-12-14 NOTE — Telephone Encounter (Signed)
emmi emailed °

## 2013-12-18 ENCOUNTER — Encounter: Payer: Self-pay | Admitting: Internal Medicine

## 2014-01-12 ENCOUNTER — Other Ambulatory Visit: Payer: Self-pay | Admitting: Family Medicine

## 2014-03-28 ENCOUNTER — Encounter: Payer: Self-pay | Admitting: Family Medicine

## 2014-03-28 ENCOUNTER — Ambulatory Visit (INDEPENDENT_AMBULATORY_CARE_PROVIDER_SITE_OTHER): Payer: 59 | Admitting: Family Medicine

## 2014-03-28 VITALS — BP 110/80 | HR 58 | Temp 98.0°F | Wt 208.0 lb

## 2014-03-28 DIAGNOSIS — I499 Cardiac arrhythmia, unspecified: Secondary | ICD-10-CM

## 2014-03-28 DIAGNOSIS — R002 Palpitations: Secondary | ICD-10-CM | POA: Insufficient documentation

## 2014-03-28 NOTE — Patient Instructions (Signed)
Continue only one cup of caffeinated beverages daily  We will set you up in cardiology to get a monitor to determine if indeed you're having an arrhythmia

## 2014-03-28 NOTE — Progress Notes (Signed)
Pre visit review using our clinic review tool, if applicable. No additional management support is needed unless otherwise documented below in the visit note. 

## 2014-03-28 NOTE — Progress Notes (Signed)
   Subjective:    Patient ID: Jason Gould, male    DOB: 10/26/1950, 64 y.o.   MRN: [id number]  HPI Jason Gould is a 64 year old married male nonsmoker who comes in today for evaluation of palpitations. He says he's had 2 episodes over the last couple months we felt his heart was racing. He's decreased his caffeine consumption to one cup per day but the racing continues. He has no cardiac symptoms chest pain shortness of breath with exertion etc.    Review of Systems Review of systems otherwise negative    Objective:   Physical Exam  Well-developed well-nourished male no acute distress vital signs stable he is afebrile cardiopulmonary exam normal pulses 60 and regular today EKG within normal limits      Assessment & Plan:  Episodes of rapid heart rate intermittent,,,,,,,,,,,,, set up for a monitor in cardiology to see if we can detect the arrhythmia

## 2014-04-02 ENCOUNTER — Encounter: Payer: Self-pay | Admitting: *Deleted

## 2014-04-02 ENCOUNTER — Encounter (INDEPENDENT_AMBULATORY_CARE_PROVIDER_SITE_OTHER): Payer: 59

## 2014-04-02 DIAGNOSIS — R002 Palpitations: Secondary | ICD-10-CM

## 2014-04-02 NOTE — Progress Notes (Signed)
Patient ID: Jason Gould, male   DOB: 05/06/1950, [age] y.o.   MRN: [id number] [facility] 24 hour holter monitor to be applied to patient.

## 2014-04-19 ENCOUNTER — Ambulatory Visit (INDEPENDENT_AMBULATORY_CARE_PROVIDER_SITE_OTHER): Payer: 59 | Admitting: Cardiology

## 2014-04-19 ENCOUNTER — Encounter: Payer: Self-pay | Admitting: Cardiology

## 2014-04-19 VITALS — BP 128/76 | HR 60 | Ht 72.0 in | Wt 211.0 lb

## 2014-04-19 DIAGNOSIS — I491 Atrial premature depolarization: Secondary | ICD-10-CM

## 2014-04-19 NOTE — Patient Instructions (Signed)
Your physician recommends that you continue on your current medications as directed. Please refer to the Current Medication list given to you today.  Follow up as needed  

## 2014-04-19 NOTE — Progress Notes (Signed)
Jason Gould Date of Birth:  Mar 04, 1951 [facility] [address] [place], [place]  [address] [phone]        Fax   [phone]   History of Present Illness: This pleasant 64 year old male is seen at the request of Dr. Alonza Smoker.  He is being seen in regard to a possible arrhythmia.  On one or 2 occasions in December the patient was aware of a sensation of possible fluttering of his heart.  One of his neighbors who is a physician told him that he should get this checked out because it could be atrial fibrillation which could lead to a stroke.  He subsequently saw Dr. Tawanna Cooler in the office who arranged for a Holter monitor.  The Holter monitor was reviewed today.  It shows that the patient is in normal sinus rhythm.  The monitor detected occasional premature atrial beats and occasional 2 and 3 beat runs of atrial tachycardia.  There was no atrial fibrillation. The patient denies any symptoms of TIA or stroke.  He denies any chest pain or shortness of breath.  He is an Pensions consultant.  He does not get much regular exercise except for walking his dog and doing yard work.  He does not use tobacco products.  He drinks occasional beer but not to excess.  He also tries to limit his caffeine. His family history reveals that both of his parents are in their mid 40s and are healthy.  His mother has a pacemaker for slow heart rate.  He does not think that she has any history of atrial fibrillation.  The patient's father is in his mid 86s and is healthy with no heart concerns.  As far as the patient knows there is no family history of atrial fibrillation. Patient has a history of mild high blood pressure and is on metoprolol and chlorthalidone.  He has a history of mild hypercholesterolemia and is on 20 mg of Lipitor daily.  Current Outpatient Prescriptions  Medication Sig Dispense Refill  . atorvastatin (LIPITOR) 20 MG tablet TAKE 1 TABLET DAILY. 90 tablet 3  . chlorthalidone (HYGROTON)  25 MG tablet TAKE 1 TABLET ONCE DAILY. 30 tablet 3  . metoprolol succinate (TOPROL-XL) 50 MG 24 hr tablet Take 50 mg by mouth as directed. 1/2 tablet daily     No current facility-administered medications for this visit.    No Known Allergies  Patient Active Problem List   Diagnosis Date Noted  . Palpitations 03/28/2014  . Essential hypertension, benign 05/09/2012  . Hyperlipidemia 01/10/2008  . COLONIC POLYPS, HX OF 01/10/2008    History  Smoking status  . Never Smoker   Smokeless tobacco  . Never Used    History  Alcohol Use  . Yes    Family History  Problem Relation Age of Onset  . Hyperlipidemia Mother   . Colon cancer Mother   . Hypertension Father   . Hyperlipidemia Father   . Colon cancer Brother     Review of Systems: Constitutional: no fever chills diaphoresis or fatigue or change in weight.  Head and neck: no hearing loss, no epistaxis, no photophobia or visual disturbance. Respiratory: No cough, shortness of breath or wheezing. Cardiovascular: No chest pain peripheral edema, positive for occasional palpitations about one month ago Gastrointestinal: No abdominal distention, no abdominal pain, no change in bowel habits hematochezia or melena. Genitourinary: No dysuria, no frequency, no urgency, no nocturia. Musculoskeletal:No arthralgias, no back pain, no gait disturbance or  myalgias. Neurological: No dizziness, no headaches, no numbness, no seizures, no syncope, no weakness, no tremors. Hematologic: No lymphadenopathy, no easy bruising. Psychiatric: No confusion, no hallucinations, no sleep disturbance.   Wt Readings from Last 3 Encounters:  04/19/14 211 lb (95.709 kg)  03/28/14 208 lb (94.348 kg)  12/13/13 207 lb (93.895 kg)    Physical Exam: Filed Vitals:   04/19/14 1558  BP: 128/76  Pulse: 60  The patient appears to be in no distress.  Head and neck exam reveals that the pupils are equal and reactive.  The extraocular movements are full.   There is no scleral icterus.  Mouth and pharynx are benign.  No lymphadenopathy.  No carotid bruits.  The jugular venous pressure is normal.  Thyroid is not enlarged or tender.  Chest is clear to percussion and auscultation.  No rales or rhonchi.  Expansion of the chest is symmetrical.  Heart reveals no abnormal lift or heave.  First and second heart sounds are normal.  There is no murmur gallop rub or click.  The abdomen is soft and nontender.  Bowel sounds are normoactive.  There is no hepatosplenomegaly or mass.  There are no abdominal bruits.  Extremities reveal no phlebitis or edema.  Pedal pulses are good.  There is no cyanosis or clubbing.  Neurologic exam is normal strength and no lateralizing weakness.  No sensory deficits.  Integument reveals no rash  EKG today shows sinus bradycardia at 56/m.  No premature beats seen on today's EKG.  No ischemic changes.  Assessment / Plan: 1.  Occasional palpitations. 2.  Holter monitor demonstrates occasional PACs and occasional 3 beat runs of consecutive atrial premature beats but no evidence of atrial fibrillation. 3.  History of mild essential hypertension. 4.  History of hyperlipidemia, on statin  Recommendation: No further workup at this time.  The patient will continue to monitor his heartbeat.  We talked about how to check his radial pulse.  If in the future he does have sustained arrhythmia it would be important to  get to a medical facility promptly to get documentation by EKG tracing of the arrhythmia. The patient will continue current medication.  Continue to try to get regular aerobic exercise. Many thanks for the opportunity to see this pleasant gentleman with you.  We did not make him a return appointment today with but would be happy to see him again  in the future on an as-needed basis.

## 2014-05-17 ENCOUNTER — Encounter: Payer: Self-pay | Admitting: Internal Medicine

## 2014-07-02 ENCOUNTER — Other Ambulatory Visit: Payer: Self-pay | Admitting: Family Medicine

## 2014-08-09 ENCOUNTER — Other Ambulatory Visit: Payer: Self-pay | Admitting: Family Medicine

## 2014-11-25 ENCOUNTER — Other Ambulatory Visit: Payer: Self-pay | Admitting: Family Medicine

## 2014-12-25 ENCOUNTER — Other Ambulatory Visit: Payer: Self-pay | Admitting: Family Medicine

## 2015-02-01 ENCOUNTER — Other Ambulatory Visit: Payer: Self-pay | Admitting: Family Medicine

## 2015-03-06 ENCOUNTER — Telehealth: Payer: Self-pay | Admitting: Internal Medicine

## 2015-03-07 NOTE — Telephone Encounter (Signed)
Ok with me 

## 2015-03-11 ENCOUNTER — Encounter: Payer: Self-pay | Admitting: Internal Medicine

## 2015-03-11 NOTE — Telephone Encounter (Signed)
Left message for patient to return my call.

## 2015-03-27 ENCOUNTER — Ambulatory Visit (AMBULATORY_SURGERY_CENTER): Payer: Self-pay | Admitting: *Deleted

## 2015-03-27 VITALS — Ht 72.0 in | Wt 210.0 lb

## 2015-03-27 DIAGNOSIS — Z800 Family history of malignant neoplasm of digestive organs: Secondary | ICD-10-CM

## 2015-03-27 MED ORDER — NA SULFATE-K SULFATE-MG SULF 17.5-3.13-1.6 GM/177ML PO SOLN: 1.0000 | Freq: Once | ORAL | Status: DC

## 2015-03-27 NOTE — Progress Notes (Signed)
No egg or soy allergy known to patient  No issues with past sedation with any surgeries  or procedures, no intubation problems  No diet pills per patient or blood thinners No home 02 use per patient   emmi video declined

## 2015-04-05 ENCOUNTER — Other Ambulatory Visit: Payer: Self-pay | Admitting: Family Medicine

## 2015-04-10 ENCOUNTER — Encounter: Payer: Self-pay | Admitting: Internal Medicine

## 2015-04-10 ENCOUNTER — Ambulatory Visit (AMBULATORY_SURGERY_CENTER): Payer: 59 | Admitting: Internal Medicine

## 2015-04-10 VITALS — BP 134/83 | HR 58 | Temp 97.6°F | Resp 11

## 2015-04-10 DIAGNOSIS — Z1211 Encounter for screening for malignant neoplasm of colon: Secondary | ICD-10-CM

## 2015-04-10 DIAGNOSIS — Z800 Family history of malignant neoplasm of digestive organs: Secondary | ICD-10-CM

## 2015-04-10 MED ORDER — SODIUM CHLORIDE 0.9 % IV SOLN: 500.0000 mL | INTRAVENOUS | Status: DC

## 2015-04-10 NOTE — Op Note (Signed)
[facility] [address] [place], [address]   COLONOSCOPY PROCEDURE REPORT  PATIENT: Jason Gould, Jason Gould  MR#: [id number] BIRTHDATE: 1950/06/19 , 65  yrs. old GENDER: male ENDOSCOPIST: Roxy Cedar, MD REFERRED WG:[id number] Recall, PROCEDURE DATE:  04/10/2015 PROCEDURE:   Colonoscopy, screening First Screening Colonoscopy - Avg.  risk and is 50 yrs.  old or older - No.  Prior Negative Screening - Now for repeat screening. Less than 10 yrs Prior Negative Screening - Now for repeat screening.  Above average risk  History of Adenoma - Now for follow-up colonoscopy & has been > or = to 3 yrs.  N/A  Polyps removed today? No Recommend repeat exam, <10 yrs? Yes high risk ASA CLASS:   Class II INDICATIONS:Screening for colonic neoplasia and FH Colon or Rectal Adenocarcinoma (mother with probable malignant polyp, sister with colorectal cancer).. Prior examinations (Dr. Juanda Chance) 1999, 2004, and 2010. Diverticulosis only MEDICATIONS: Monitored anesthesia care and Propofol 250 mg IV  DESCRIPTION OF PROCEDURE:   After the risks benefits and alternatives of the procedure were thoroughly explained, informed consent was obtained.  The digital rectal exam revealed no abnormalities of the rectum.   The LB [id number]  endoscope was introduced through the anus and advanced to the cecum, which was identified by both the appendix and ileocecal valve. No adverse events experienced.   The quality of the prep was excellent. (Suprep was used)  The instrument was then slowly withdrawn as the colon was fully examined. Estimated blood loss is zero unless otherwise noted in this procedure report.  COLON FINDINGS: There was moderate diverticulosis noted in the ascending colon and sigmoid colon.   The examination was otherwise normal.  Retroflexed views revealed no abnormalities. No polyps. The time to cecum = 3.1 Withdrawal time = 14.7   The scope was withdrawn and the procedure  completed. COMPLICATIONS: There were no immediate complications.  ENDOSCOPIC IMPRESSION: 1.   Moderate diverticulosis was noted in the ascending and sigmoid colon 2.   The examination was otherwise normal  RECOMMENDATIONS: 1. Follow up colonoscopy in 5 years  (family history)  eSigned:  Roxy Cedar, MD 04/10/2015 3:07 PM   cc: The Patient and Roderick Pee, MD

## 2015-04-10 NOTE — Progress Notes (Signed)
A/ox3 pleased with MAC, report to Jane RN 

## 2015-04-10 NOTE — Patient Instructions (Signed)
YOU HAD AN ENDOSCOPIC PROCEDURE TODAY AT THE [facility]:   Refer to the procedure report that was given to you for any specific questions about what was found during the examination.  If the procedure report does not answer your questions, please call your gastroenterologist to clarify.  If you requested that your care partner not be given the details of your procedure findings, then the procedure report has been included in a sealed envelope for you to review at your convenience later.  YOU SHOULD EXPECT: Some feelings of bloating in the abdomen. Passage of more gas than usual.  Walking can help get rid of the air that was put into your GI tract during the procedure and reduce the bloating. If you had a lower endoscopy (such as a colonoscopy or flexible sigmoidoscopy) you may notice spotting of blood in your stool or on the toilet paper. If you underwent a bowel prep for your procedure, you may not have a normal bowel movement for a few days.  Please Note:  You might notice some irritation and congestion in your nose or some drainage.  This is from the oxygen used during your procedure.  There is no need for concern and it should clear up in a day or so.  SYMPTOMS TO REPORT IMMEDIATELY:   Following lower endoscopy (colonoscopy or flexible sigmoidoscopy):  Excessive amounts of blood in the stool  Significant tenderness or worsening of abdominal pains  Swelling of the abdomen that is new, acute  Fever of 100F or higher   For urgent or emergent issues, a gastroenterologist can be reached at any hour by calling [phone].   DIET: Your first meal following the procedure should be a small meal and then it is ok to progress to your normal diet. Heavy or fried foods are harder to digest and may make you feel nauseous or bloated.  Likewise, meals heavy in dairy and vegetables can increase bloating.  Drink plenty of fluids but you should avoid alcoholic beverages for 24  hours.  ACTIVITY:  You should plan to take it easy for the rest of today and you should NOT DRIVE or use heavy machinery until tomorrow (because of the sedation medicines used during the test).    FOLLOW UP: Our staff will call the number listed on your records the next business day following your procedure to check on you and address any questions or concerns that you may have regarding the information given to you following your procedure. If we do not reach you, we will leave a message.  However, if you are feeling well and you are not experiencing any problems, there is no need to return our call.  We will assume that you have returned to your regular daily activities without incident.  If any biopsies were taken you will be contacted by phone or by letter within the next 1-3 weeks.  Please call us at [phone] if you have not heard about the biopsies in 3 weeks.    SIGNATURES/CONFIDENTIALITY: You and/or your care partner have signed paperwork which will be entered into your electronic medical record.  These signatures attest to the fact that that the information above on your After Visit Summary has been reviewed and is understood.  Full responsibility of the confidentiality of this discharge information lies with you and/or your care-partner.  Diverticulosis and high fiber diet information given. 

## 2015-04-11 ENCOUNTER — Telehealth: Payer: Self-pay

## 2015-04-11 NOTE — Telephone Encounter (Signed)
  Follow up Call-  Call back number 04/10/2015  Post procedure Call Back phone  # [phone]  Permission to leave phone message Yes    Patient was called after his procedure on 04/10/2015. No answer at the number given for follow up. A message was left on his answering machine.

## 2015-05-27 ENCOUNTER — Other Ambulatory Visit: Payer: Self-pay | Admitting: Family Medicine

## 2015-06-11 ENCOUNTER — Telehealth: Payer: Self-pay | Admitting: Family Medicine

## 2015-06-11 DIAGNOSIS — Z0184 Encounter for antibody response examination: Secondary | ICD-10-CM

## 2015-06-11 NOTE — Telephone Encounter (Signed)
Orders placed.

## 2015-06-11 NOTE — Telephone Encounter (Signed)
Patient is coming in for labs on Thursday morning 06/12/15.  He wants to be tested for Hep C while he is here.

## 2015-06-12 ENCOUNTER — Other Ambulatory Visit (INDEPENDENT_AMBULATORY_CARE_PROVIDER_SITE_OTHER): Payer: 59

## 2015-06-12 DIAGNOSIS — Z0184 Encounter for antibody response examination: Secondary | ICD-10-CM

## 2015-06-12 DIAGNOSIS — Z0000 Encounter for general adult medical examination without abnormal findings: Secondary | ICD-10-CM

## 2015-06-12 LAB — POC URINALSYSI DIPSTICK (AUTOMATED)
BILIRUBIN UA: NEGATIVE
GLUCOSE UA: NEGATIVE
Ketones, UA: NEGATIVE
Leukocytes, UA: NEGATIVE
NITRITE UA: NEGATIVE
RBC UA: NEGATIVE
Spec Grav, UA: 1.015
Urobilinogen, UA: 0.2
pH, UA: 7

## 2015-06-12 LAB — CBC WITH DIFFERENTIAL/PLATELET
BASOS ABS: 0.1 10*3/uL (ref 0.0–0.1)
BASOS PCT: 1 % (ref 0.0–3.0)
Eosinophils Absolute: 0.2 10*3/uL (ref 0.0–0.7)
Eosinophils Relative: 2.4 % (ref 0.0–5.0)
HEMATOCRIT: 44 % (ref 39.0–52.0)
HEMOGLOBIN: 15.2 g/dL (ref 13.0–17.0)
LYMPHS PCT: 23.1 % (ref 12.0–46.0)
Lymphs Abs: 2 10*3/uL (ref 0.7–4.0)
MCHC: 34.6 g/dL (ref 30.0–36.0)
MCV: 88.1 fl (ref 78.0–100.0)
MONOS PCT: 8.8 % (ref 3.0–12.0)
Monocytes Absolute: 0.8 10*3/uL (ref 0.1–1.0)
NEUTROS ABS: 5.6 10*3/uL (ref 1.4–7.7)
Neutrophils Relative %: 64.7 % (ref 43.0–77.0)
PLATELETS: 236 10*3/uL (ref 150.0–400.0)
RBC: 4.99 Mil/uL (ref 4.22–5.81)
RDW: 13.8 % (ref 11.5–15.5)
WBC: 8.6 10*3/uL (ref 4.0–10.5)

## 2015-06-12 LAB — LIPID PANEL
CHOL/HDL RATIO: 4
CHOLESTEROL: 158 mg/dL (ref 0–200)
HDL: 36.7 mg/dL — AB (ref >39.00)
LDL CALC: 92 mg/dL (ref 0–99)
NonHDL: 121.17
TRIGLYCERIDES: 147 mg/dL (ref 0.0–149.0)
VLDL: 29.4 mg/dL (ref 0.0–40.0)

## 2015-06-12 LAB — HEPATIC FUNCTION PANEL
ALBUMIN: 4.3 g/dL (ref 3.5–5.2)
ALK PHOS: 52 U/L (ref 39–117)
ALT: 38 U/L (ref 0–53)
AST: 27 U/L (ref 0–37)
Bilirubin, Direct: 0.1 mg/dL (ref 0.0–0.3)
TOTAL PROTEIN: 6.3 g/dL (ref 6.0–8.3)
Total Bilirubin: 0.7 mg/dL (ref 0.2–1.2)

## 2015-06-12 LAB — BASIC METABOLIC PANEL
BUN: 17 mg/dL (ref 6–23)
CALCIUM: 9.4 mg/dL (ref 8.4–10.5)
CHLORIDE: 100 meq/L (ref 96–112)
CO2: 31 meq/L (ref 19–32)
Creatinine, Ser: 1.07 mg/dL (ref 0.40–1.50)
GFR: 74.14 mL/min (ref >60.00)
Glucose, Bld: 97 mg/dL (ref 70–99)
Potassium: 3.6 mEq/L (ref 3.5–5.1)
SODIUM: 139 meq/L (ref 135–145)

## 2015-06-12 LAB — TSH: TSH: 2.19 u[IU]/mL (ref 0.35–4.50)

## 2015-06-12 LAB — PSA: PSA: 2.2 ng/mL (ref 0.10–4.00)

## 2015-06-13 LAB — HEPATITIS C ANTIBODY: HCV Ab: NEGATIVE

## 2015-06-18 ENCOUNTER — Ambulatory Visit (INDEPENDENT_AMBULATORY_CARE_PROVIDER_SITE_OTHER): Payer: 59 | Admitting: Family Medicine

## 2015-06-18 VITALS — BP 120/80 | Temp 98.6°F | Ht 72.0 in | Wt 210.0 lb

## 2015-06-18 DIAGNOSIS — Z0000 Encounter for general adult medical examination without abnormal findings: Secondary | ICD-10-CM

## 2015-06-18 DIAGNOSIS — I10 Essential (primary) hypertension: Secondary | ICD-10-CM

## 2015-06-18 DIAGNOSIS — Z86010 Personal history of colonic polyps: Secondary | ICD-10-CM | POA: Diagnosis not present

## 2015-06-18 DIAGNOSIS — E785 Hyperlipidemia, unspecified: Secondary | ICD-10-CM | POA: Diagnosis not present

## 2015-06-18 MED ORDER — ATORVASTATIN CALCIUM 20 MG PO TABS: 20.0000 mg | ORAL_TABLET | Freq: Every day | ORAL | Status: DC

## 2015-06-18 MED ORDER — METOPROLOL SUCCINATE ER 25 MG PO TB24: 25.0000 mg | ORAL_TABLET | Freq: Every day | ORAL | Status: DC

## 2015-06-18 MED ORDER — CHLORTHALIDONE 25 MG PO TABS: 25.0000 mg | ORAL_TABLET | Freq: Every day | ORAL | Status: DC

## 2015-06-18 NOTE — Patient Instructions (Signed)
Continue current medications  Walk 30 minutes daily  Follow-up in one year sooner if any problem

## 2015-06-18 NOTE — Progress Notes (Signed)
Pre visit review using our clinic review tool, if applicable. No additional management support is needed unless otherwise documented below in the visit note. 

## 2015-06-18 NOTE — Progress Notes (Signed)
Subjective:    Patient ID: Jason Gould, male    DOB: 07/21/1950, 65 y.o.   MRN: [id number]  HPI Jason Gould is a 65 year old married male nonsmoker,,,,,,,, attorney by occupation,,,,, who comes in today for general physical examination because of a history of hypertension, hyperlipidemia  He takes a beta blocker 25 mg Toprol daily for hypertension. We used a beta blocker because he was also having some palpitations. He has no negative sexual side effects from the Toprol.  He takes Lipitor and aspirin daily for hyperlipidemia lipids are at goal. He also takes chlorthalidone 25 mg daily BP 120/80  He gets routine eye care, dental care, colonoscopy and GI every 5 years. His sister recently had part of her colon removed for colon cancer. His mother also had colon cancer. Dad alive but developing dementia in his 4080s. Mom died in December 86 years of age  Vaccinations up-to-date  Social history,,,,,,,,,, [place] he is a Pensions consultantattorney by trade. In his wife have 2 children who are grown and gone. He exercises sporadically by walking his dog and doing yard work   Review of Systems  Constitutional: Negative.   HENT: Negative.   Eyes: Negative.   Respiratory: Negative.   Cardiovascular: Negative.   Gastrointestinal: Negative.   Endocrine: Negative.   Genitourinary: Negative.   Musculoskeletal: Negative.   Skin: Negative.   Allergic/Immunologic: Negative.   Neurological: Negative.   Hematological: Negative.   Psychiatric/Behavioral: Negative.        Objective:   Physical Exam  Constitutional: He is oriented to person, place, and time. He appears well-developed and well-nourished.  HENT:  Head: Normocephalic and atraumatic.  Right Ear: External ear normal.  Left Ear: External ear normal.  Nose: Nose normal.  Mouth/Throat: Oropharynx is clear and moist.  Eyes: Conjunctivae and EOM are normal. Pupils are equal, round, and reactive to light.  Neck: Normal  range of motion. Neck supple. No JVD present. No tracheal deviation present. No thyromegaly present.  Cardiovascular: Normal rate, regular rhythm, normal heart sounds and intact distal pulses.  Exam reveals no gallop and no friction rub.   No murmur heard. No carotid nor aortic bruits peripheral pulses 2+ and symmetrical  Pulmonary/Chest: Effort normal and breath sounds normal. No stridor. No respiratory distress. He has no wheezes. He has no rales. He exhibits no tenderness.  Abdominal: Soft. Bowel sounds are normal. He exhibits no distension and no mass. There is no tenderness. There is no rebound and no guarding.  Genitourinary: Rectum normal, prostate normal and penis normal. Guaiac negative stool. No penile tenderness.  Musculoskeletal: Normal range of motion. He exhibits no edema or tenderness.  Lymphadenopathy:    He has no cervical adenopathy.  Neurological: He is alert and oriented to person, place, and time. He has normal reflexes. No cranial nerve deficit. He exhibits normal muscle tone.  Skin: Skin is warm and dry. No rash noted. No erythema. No pallor.  Total body skin exam normal. His garden variety of freckles Mollo's capillaries hemangiomas and seborrheic keratosis. He also has numerous skin tags on his upper eyelids that are now interfering with his vision. Recommend he see a dermatologist for removal  Psychiatric: He has a normal mood and affect. His behavior is normal. Judgment and thought content normal.  Nursing note and vitals reviewed.         Assessment & Plan:  Healthy male  Hypertension at goal,,,,,, continue current therapy  Hyperlipidemia goal,,,,,,,,, continue current therapy  Bilateral hearing loss,,,,,,,, continue hearing aids  History of colon polyps,,,,,,,, follow-up colonoscopy as outlined i yearly DRE and stool guaiac

## 2015-11-25 ENCOUNTER — Telehealth: Payer: Self-pay | Admitting: Family Medicine

## 2015-11-25 NOTE — Telephone Encounter (Signed)
Spoke with pt informing him that per Dr.Todd he should contact the [facility] office @ [phone] and speak to someone in the primary care division and see if he can get his father established. Pt verbalized understanding.  Also per Dr.Todd if he is unable to get his father established call and Dr. Tawanna Coolerodd will see what he can figure out.

## 2015-11-25 NOTE — Telephone Encounter (Signed)
Pt would like dr todd to recommend a physician for his father who will be moving to [place] this weekend

## 2015-11-25 NOTE — Telephone Encounter (Signed)
Called and left voicemail for pt to return call to office.  

## 2016-06-04 DIAGNOSIS — H2513 Age-related nuclear cataract, bilateral: Secondary | ICD-10-CM | POA: Diagnosis not present

## 2016-06-21 ENCOUNTER — Other Ambulatory Visit: Payer: Self-pay | Admitting: Family Medicine

## 2016-10-09 DIAGNOSIS — J029 Acute pharyngitis, unspecified: Secondary | ICD-10-CM | POA: Diagnosis not present

## 2016-10-09 DIAGNOSIS — Z9289 Personal history of other medical treatment: Secondary | ICD-10-CM | POA: Diagnosis not present

## 2016-12-03 ENCOUNTER — Encounter: Payer: Self-pay | Admitting: Family Medicine

## 2016-12-24 ENCOUNTER — Emergency Department (HOSPITAL_COMMUNITY): Payer: 59

## 2016-12-24 ENCOUNTER — Emergency Department (HOSPITAL_COMMUNITY)
Admission: EM | Admit: 2016-12-24 | Discharge: 2016-12-24 | Disposition: A | Discharge status: Home / Self Care | Payer: 59 | Attending: Emergency Medicine | Admitting: Emergency Medicine

## 2016-12-24 ENCOUNTER — Encounter (HOSPITAL_COMMUNITY): Payer: Self-pay

## 2016-12-24 DIAGNOSIS — R51 Headache: Secondary | ICD-10-CM | POA: Insufficient documentation

## 2016-12-24 DIAGNOSIS — Z7982 Long term (current) use of aspirin: Secondary | ICD-10-CM | POA: Diagnosis not present

## 2016-12-24 DIAGNOSIS — I10 Essential (primary) hypertension: Secondary | ICD-10-CM | POA: Insufficient documentation

## 2016-12-24 DIAGNOSIS — Z79899 Other long term (current) drug therapy: Secondary | ICD-10-CM | POA: Diagnosis not present

## 2016-12-24 DIAGNOSIS — R519 Headache, unspecified: Secondary | ICD-10-CM

## 2016-12-24 MED ORDER — MORPHINE SULFATE (PF) 4 MG/ML IV SOLN
4.0000 mg | Freq: Once | INTRAVENOUS | Status: DC
  Filled 2016-12-24: qty 1

## 2016-12-24 MED ORDER — ONDANSETRON 4 MG PO TBDP: 4.0000 mg | ORAL_TABLET | Freq: Three times a day (TID) | ORAL | 0 refills | Status: DC | PRN

## 2016-12-24 MED ORDER — SODIUM CHLORIDE 0.9 % IV BOLUS (SEPSIS)
1000.0000 mL | Freq: Once | INTRAVENOUS | Status: AC
  Administered 2016-12-24: 1000 mL via INTRAVENOUS

## 2016-12-24 MED ORDER — PROCHLORPERAZINE EDISYLATE 5 MG/ML IJ SOLN
10.0000 mg | Freq: Once | INTRAMUSCULAR | Status: AC
  Administered 2016-12-24: 10 mg via INTRAVENOUS
  Filled 2016-12-24: qty 2

## 2016-12-24 MED ORDER — TIZANIDINE HCL 2 MG PO CAPS: 2.0000 mg | ORAL_CAPSULE | Freq: Three times a day (TID) | ORAL | 0 refills | Status: DC

## 2016-12-24 MED ORDER — ACETAMINOPHEN 500 MG PO TABS
1000.0000 mg | ORAL_TABLET | Freq: Once | ORAL | Status: AC
  Administered 2016-12-24: 1000 mg via ORAL
  Filled 2016-12-24: qty 2

## 2016-12-24 NOTE — ED Triage Notes (Signed)
Patient states he woke at 0250 and had a headache and headache worsening with c/o nausea and back pain. Patient  States he had a headache that last weekend  That was intense.

## 2016-12-24 NOTE — Discharge Instructions (Signed)
As discussed, your evaluation today has been largely reassuring.  But, it is important that you monitor your condition carefully, and do not hesitate to return to the ED if you develop new, or concerning changes in your condition. ? ?Otherwise, please follow-up with your physician for appropriate ongoing care. ? ?

## 2016-12-24 NOTE — ED Provider Notes (Signed)
[facility] DEPT Provider Note   CSN: [id number] Arrival date & time: 12/24/16  1234     History   Chief Complaint Chief Complaint  Patient presents with  . Headache  . Emesis    HPI Jason Gould is a 66 y.o. male.  HPI Patient presents with concern of a headache.  Patient has no history of headache, only has medical history of hypertension, for which he takes beta blocker daily. One week ago the patient had an episode of headache, frontal, sore, severe, lasted approximately one day, improved with rest. Today, approximately 15 hours ago the patient developed a similar headache, frontal, sore, severe, sharp. No medication taken for relief. He notes that during the first episode and today, he has had some bilateral paraspinal lower cervical tightness. During the initial headache episode the patient had some relief with massage.  Today there is new nausea, but no abdominal pain. Patient vomited several times.   Past Medical History:  Diagnosis Date  . Allergy    seasonal  . Hx of colonic polyps   . Hyperlipidemia   . Hypertension     Patient Active Problem List   Diagnosis Date Noted  . Routine general medical examination at a health care facility 06/18/2015  . Palpitations 03/28/2014  . Essential hypertension, benign 05/09/2012  . Hyperlipidemia 01/10/2008  . COLONIC POLYPS, HX OF 01/10/2008    Past Surgical History:  Procedure Laterality Date  . COLONOSCOPY    . TONSILLECTOMY    . VASECTOMY         Home Medications    Prior to Admission medications   Medication Sig Start Date End Date Taking? Authorizing Provider  aspirin EC 81 MG tablet Take 81 mg by mouth daily.    [provider]  atorvastatin (LIPITOR) 20 MG tablet TAKE 1 TABLET DAILY. 06/21/16   Dorena Cookey, MD  chlorthalidone (HYGROTON) 25 MG tablet Take 1 tablet (25 mg total) by mouth daily. 06/18/15   Dorena Cookey, MD  chlorthalidone (HYGROTON) 25 MG tablet TAKE 1 TABLET ONCE  DAILY. 06/21/16   Dorena Cookey, MD  metoprolol succinate (TOPROL-XL) 25 MG 24 hr tablet TAKE 1 TABLET ONCE DAILY. 06/21/16   Dorena Cookey, MD  Na Sulfate-K Sulfate-Mg Sulf (SUPREP BOWEL PREP) SOLN Take 1 kit by mouth once. suprep as directed. No substitutions 03/27/15   Irene Shipper, MD  Omega-3 Fatty Acids (FISH OIL PO) Take by mouth daily.    [provider]    Family History Family History  Problem Relation Age of Onset  . Hyperlipidemia Mother   . Colon cancer Mother   . Hypertension Father   . Hyperlipidemia Father   . Colon cancer Sister   . Colon polyps Neg Hx   . Esophageal cancer Neg Hx   . Rectal cancer Neg Hx   . Stomach cancer Neg Hx     Social History Social History  Substance Use Topics  . Smoking status: Never Smoker  . Smokeless tobacco: Never Used  . Alcohol use 1.2 oz/week    2 Cans of beer per week     Comment: 2-3 beers a week      Allergies   Patient has no known allergies.   Review of Systems Review of Systems  Constitutional:       Per HPI, otherwise negative  HENT:       Per HPI, otherwise negative  Eyes: Positive for photophobia.  Respiratory:  Per HPI, otherwise negative  Cardiovascular:       Per HPI, otherwise negative  Gastrointestinal: Positive for nausea and vomiting. Negative for abdominal pain.  Endocrine:       Negative aside from HPI  Genitourinary:       Neg aside from HPI   Musculoskeletal:       Per HPI, otherwise negative  Skin: Negative.   Neurological: Positive for headaches. Negative for syncope and weakness.     Physical Exam Updated Vital Signs BP (!) 145/111 (BP Location: Left Arm)   Pulse 70   Temp (!) 97.4 F (36.3 C) (Oral)   Resp 19   Ht 6' (1.829 m)   Wt 90.7 kg (200 lb)   SpO2 98%   BMI 27.12 kg/m   Physical Exam  Constitutional: He is oriented to person, place, and time. He appears well-developed. No distress.  HENT:  Head: Normocephalic and atraumatic.  Eyes: Conjunctivae  and EOM are normal.  Neck:  Neck unremarkable exam, some musculoskeletal tightness, no point tenderness, no loss of range of motion.  Cardiovascular: Normal rate and regular rhythm.   Pulmonary/Chest: Effort normal. No stridor. No respiratory distress.  Abdominal: He exhibits no distension.  Musculoskeletal: He exhibits no edema.  Neurological: He is alert and oriented to person, place, and time. He displays no tremor. No cranial nerve deficit. He exhibits normal muscle tone. He displays no seizure activity. Coordination normal.  Skin: Skin is warm and dry.  Psychiatric: He has a normal mood and affect.  Nursing note and vitals reviewed.    ED Treatments / Results   Radiology Ct Head Wo Contrast  Result Date: 12/24/2016 CLINICAL DATA:  Severe headache EXAM: CT HEAD WITHOUT CONTRAST TECHNIQUE: Contiguous axial images were obtained from the base of the skull through the vertex without intravenous contrast. COMPARISON:  None. FINDINGS: Brain: No mass lesion, intraparenchymal hemorrhage or extra-axial collection. No evidence of acute cortical infarct. Brain parenchyma and CSF-containing spaces are normal for age. Vascular: No hyperdense vessel or unexpected calcification. Skull: Normal visualized skull base, calvarium and extracranial soft tissues. Sinuses/Orbits: No sinus fluid levels or advanced mucosal thickening. No mastoid effusion. Normal orbits. IMPRESSION: Normal head CT. Electronically Signed   By: Ulyses Jarred M.D.   On: 12/24/2016 18:48    Procedures Procedures (including critical care time)  Medications Ordered in ED Medications  sodium chloride 0.9 % bolus 1,000 mL (not administered)  prochlorperazine (COMPAZINE) injection 10 mg (not administered)     Initial Impression / Assessment and Plan / ED Course  I have reviewed the triage vital signs and the nursing notes.  Pertinent labs & imaging results that were available during my care of the patient were reviewed by me and  considered in my medical decision making (see chart for details).    7:14 PM Patient awake and alert in no distress, feels slightly better. I discussed all findings including reassuring CT scan with patient and his wife. With reassuring results, no ongoing vomiting, the patient will follow-up with primary care, neurology, and absent evidence for neurologic dysfunction, low suspicion for occult intracranial mass. Some suspicion for tension headache given the patient's description of bilateral lower neck pain, prior to the onset of headache.  Final Clinical Impressions(s) / ED Diagnoses   Final diagnoses:  Bad headache    New Prescriptions New Prescriptions   ONDANSETRON (ZOFRAN ODT) 4 MG DISINTEGRATING TABLET    Take 1 tablet (4 mg total) by mouth every 8 (eight) hours as  needed for nausea or vomiting.   TIZANIDINE (ZANAFLEX) 2 MG CAPSULE    Take 1 capsule (2 mg total) by mouth 3 (three) times daily.     Carmin Muskrat, MD 12/24/16 [phone]

## 2017-01-04 ENCOUNTER — Telehealth: Payer: Self-pay | Admitting: Family Medicine

## 2017-01-04 NOTE — Telephone Encounter (Signed)
Patient is wanting to switch PCPs and wants Dr. Tawanna Coolerodd to recommend a new provider.

## 2017-01-04 NOTE — Telephone Encounter (Signed)
Spoke with Jason Gould requested to be c\scheduled for a CPE if dr Tawanna Coolerodd is still seeing Jason Gould, Jason Gould is scheduled for CPE on 01/24/2017 at 1.45 pm.

## 2017-01-24 ENCOUNTER — Ambulatory Visit (INDEPENDENT_AMBULATORY_CARE_PROVIDER_SITE_OTHER): Payer: 59 | Admitting: Family Medicine

## 2017-01-24 ENCOUNTER — Encounter: Payer: Self-pay | Admitting: Family Medicine

## 2017-01-24 VITALS — BP 130/70 | HR 58 | Temp 98.3°F | Ht 72.0 in | Wt 206.0 lb

## 2017-01-24 DIAGNOSIS — Z0000 Encounter for general adult medical examination without abnormal findings: Secondary | ICD-10-CM | POA: Diagnosis not present

## 2017-01-24 DIAGNOSIS — Z23 Encounter for immunization: Secondary | ICD-10-CM | POA: Diagnosis not present

## 2017-01-24 DIAGNOSIS — E785 Hyperlipidemia, unspecified: Secondary | ICD-10-CM | POA: Diagnosis not present

## 2017-01-24 DIAGNOSIS — I10 Essential (primary) hypertension: Secondary | ICD-10-CM | POA: Diagnosis not present

## 2017-01-24 MED ORDER — METOPROLOL SUCCINATE ER 25 MG PO TB24: 25.0000 mg | ORAL_TABLET | Freq: Every day | ORAL | 4 refills | Status: DC

## 2017-01-24 MED ORDER — ATORVASTATIN CALCIUM 20 MG PO TABS: 20.0000 mg | ORAL_TABLET | Freq: Every day | ORAL | 4 refills | Status: DC

## 2017-01-24 MED ORDER — CHLORTHALIDONE 25 MG PO TABS: 25.0000 mg | ORAL_TABLET | Freq: Every day | ORAL | 4 refills | Status: DC

## 2017-01-24 NOTE — Progress Notes (Signed)
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 BP is 130/70 pulse is 60 and regular  He takes Lipitor 20 mg a day but is not taking an aspirin tablet. Advised take an aspirin tablet along with the Lipitor.  He gets routine eye care by his optometrist Dr. Lawerance BachBurns, regular dental care, colonoscopy 2017 was normal. His sister had colon cancer.  Vaccinations up-to-date tetanus booster will be 2019. Seasonal flu shot given today information given on shingles vaccine.  He walks on a regular basis.  14 point review of systems reviewed and otherwise negative  EKG was done because a history of hypertension hyperlipidemia. EKG was normal and unchanged  BP 130/70 (BP Location: Left Arm, Patient Position: Sitting, Cuff Size: Normal)   Pulse (!) 58   Temp 98.3 F (36.8 C) (Oral)   Ht 6' (1.829 m)   Wt 206 lb (93.4 kg)   BMI 27.94 kg/m  Examination HEENT were negative except for early bilateral cataracts. Neck was supple thyroid is not enlarged no carotid bruits cardiopulmonary exam normal abdominal exam normal genitalia normal circumcised male rectum normal stool guaiac-negative prostate normal extremities normal skin normal peripheral pulses normal  #1 hypertension at goal............ continue current therapy  #2 hyperlipidemia........... continue current therapy check labs  #3 bilateral hearing loss..........Marland Kitchen. recommend follow-up with cardiologist  #4 family history of colon cancer..... Sister...Marland Kitchen.Marland Kitchen.Marland Kitchen. follow-up as outlined by GI

## 2017-01-24 NOTE — Patient Instructions (Signed)
Continue current medications........Marland Kitchen. restart her aspirin  Walk 30 minutes daily  Since you have early cataracts are recommend Dr. Sol Blazingon Digby ophthalmologist for your eye exams  Labs today......... I will call you if there is anything abnormal  Check back with the audiologist to see if they can program your hearing aids to iPhone. Another option would be to see my audiologist Dr[facility] Collins Scotland[facility]ather [facility]ard[facility]  Return in one year for general physical exam sooner if any problems

## 2017-01-25 ENCOUNTER — Other Ambulatory Visit: Payer: 59

## 2017-01-25 LAB — TSH: TSH: 2.26 u[IU]/mL (ref 0.35–4.50)

## 2017-01-25 LAB — CBC WITH DIFFERENTIAL/PLATELET
Basophils Absolute: 0.1 10*3/uL (ref 0.0–0.1)
Basophils Relative: 1 % (ref 0.0–3.0)
EOS PCT: 2.3 % (ref 0.0–5.0)
Eosinophils Absolute: 0.2 10*3/uL (ref 0.0–0.7)
HEMATOCRIT: 45.4 % (ref 39.0–52.0)
Hemoglobin: 15.4 g/dL (ref 13.0–17.0)
LYMPHS PCT: 15.4 % (ref 12.0–46.0)
Lymphs Abs: 1.2 10*3/uL (ref 0.7–4.0)
MCHC: 34 g/dL (ref 30.0–36.0)
MCV: 89.3 fl (ref 78.0–100.0)
MONO ABS: 0.9 10*3/uL (ref 0.1–1.0)
Monocytes Relative: 11.3 % (ref 3.0–12.0)
NEUTROS PCT: 70 % (ref 43.0–77.0)
Neutro Abs: 5.6 10*3/uL (ref 1.4–7.7)
PLATELETS: 219 10*3/uL (ref 150.0–400.0)
RBC: 5.09 Mil/uL (ref 4.22–5.81)
RDW: 13.3 % (ref 11.5–15.5)
WBC: 8 10*3/uL (ref 4.0–10.5)

## 2017-01-25 LAB — LIPID PANEL
CHOL/HDL RATIO: 4
CHOLESTEROL: 156 mg/dL (ref 0–200)
HDL: 41.5 mg/dL (ref >39.00)
LDL CALC: 98 mg/dL (ref 0–99)
NONHDL: 114.16
Triglycerides: 82 mg/dL (ref 0.0–149.0)
VLDL: 16.4 mg/dL (ref 0.0–40.0)

## 2017-01-25 LAB — HEPATIC FUNCTION PANEL
ALT: 28 U/L (ref 0–53)
AST: 21 U/L (ref 0–37)
Albumin: 4.5 g/dL (ref 3.5–5.2)
Alkaline Phosphatase: 57 U/L (ref 39–117)
BILIRUBIN DIRECT: 0.2 mg/dL (ref 0.0–0.3)
BILIRUBIN TOTAL: 0.8 mg/dL (ref 0.2–1.2)
Total Protein: 6.4 g/dL (ref 6.0–8.3)

## 2017-01-25 LAB — PSA: PSA: 2.28 ng/mL (ref 0.10–4.00)

## 2017-01-25 LAB — BASIC METABOLIC PANEL
BUN: 11 mg/dL (ref 6–23)
CALCIUM: 9.4 mg/dL (ref 8.4–10.5)
CO2: 34 mEq/L — ABNORMAL HIGH (ref 19–32)
Chloride: 101 mEq/L (ref 96–112)
Creatinine, Ser: 1.07 mg/dL (ref 0.40–1.50)
GFR: 73.76 mL/min (ref >60.00)
Glucose, Bld: 110 mg/dL — ABNORMAL HIGH (ref 70–99)
POTASSIUM: 3.7 meq/L (ref 3.5–5.1)
Sodium: 140 mEq/L (ref 135–145)

## 2017-11-10 DIAGNOSIS — Z23 Encounter for immunization: Secondary | ICD-10-CM | POA: Diagnosis not present

## 2018-01-12 ENCOUNTER — Other Ambulatory Visit: Payer: Self-pay | Admitting: Family Medicine

## 2018-01-16 ENCOUNTER — Other Ambulatory Visit: Payer: Self-pay | Admitting: Family Medicine

## 2018-02-02 ENCOUNTER — Ambulatory Visit (INDEPENDENT_AMBULATORY_CARE_PROVIDER_SITE_OTHER): Payer: 59 | Admitting: Internal Medicine

## 2018-02-02 ENCOUNTER — Encounter: Payer: Self-pay | Admitting: Internal Medicine

## 2018-02-02 VITALS — BP 120/78 | HR 60 | Temp 98.1°F | Ht 72.0 in | Wt 205.2 lb

## 2018-02-02 DIAGNOSIS — N529 Male erectile dysfunction, unspecified: Secondary | ICD-10-CM

## 2018-02-02 DIAGNOSIS — Z23 Encounter for immunization: Secondary | ICD-10-CM

## 2018-02-02 DIAGNOSIS — I10 Essential (primary) hypertension: Secondary | ICD-10-CM | POA: Diagnosis not present

## 2018-02-02 DIAGNOSIS — Z0000 Encounter for general adult medical examination without abnormal findings: Secondary | ICD-10-CM | POA: Diagnosis not present

## 2018-02-02 DIAGNOSIS — E785 Hyperlipidemia, unspecified: Secondary | ICD-10-CM

## 2018-02-02 MED ORDER — SILDENAFIL CITRATE 25 MG PO TABS: 25.0000 mg | ORAL_TABLET | Freq: Every day | ORAL | 3 refills | Status: DC | PRN

## 2018-02-02 NOTE — Addendum Note (Signed)
Addended by: Kern ReapVEREEN, Demetric Dunnaway B on: 02/02/2018 03:52 PM   Modules accepted: Orders

## 2018-02-02 NOTE — Patient Instructions (Addendum)
BEFORE YOU LEAVE: -Labs today. -Tetanus booster  -follow up: With me next year for your annual physical.     Preventive Care 65 Years and Older, Male Preventive care refers to lifestyle choices and visits with your health care provider that can promote health and wellness. What does preventive care include?  A yearly physical exam. This is also called an annual well check.  Dental exams once or twice a year.  Routine eye exams. Ask your health care provider how often you should have your eyes checked.  Personal lifestyle choices, including: ? Daily care of your teeth and gums. ? Regular physical activity. ? Eating a healthy diet. ? Avoiding tobacco and drug use. ? Limiting alcohol use. ? Practicing safe sex. ? Taking low doses of aspirin every day. ? Taking vitamin and mineral supplements as recommended by your health care provider. What happens during an annual well check? The services and screenings done by your health care provider during your annual well check will depend on your age, overall health, lifestyle risk factors, and family history of disease. Counseling Your health care provider may ask you questions about your:  Alcohol use.  Tobacco use.  Drug use.  Emotional well-being.  Home and relationship well-being.  Sexual activity.  Eating habits.  History of falls.  Memory and ability to understand (cognition).  Work and work Statistician.  Screening You may have the following tests or measurements:  Height, weight, and BMI.  Blood pressure.  Lipid and cholesterol levels. These may be checked every 5 years, or more frequently if you are over 6 years old.  Skin check.  Lung cancer screening. You may have this screening every year starting at age 2 if you have a 30-pack-year history of smoking and currently smoke or have quit within the past 15 years.  Fecal occult blood test (FOBT) of the stool. You may have this test every year starting at  age 73.  Flexible sigmoidoscopy or colonoscopy. You may have a sigmoidoscopy every 5 years or a colonoscopy every 10 years starting at age 9.  Prostate cancer screening. Recommendations will vary depending on your family history and other risks.  Hepatitis C blood test.  Hepatitis B blood test.  Sexually transmitted disease (STD) testing.  Diabetes screening. This is done by checking your blood sugar (glucose) after you have not eaten for a while (fasting). You may have this done every 1-3 years.  Abdominal aortic aneurysm (AAA) screening. You may need this if you are a current or former smoker.  Osteoporosis. You may be screened starting at age 25 if you are at high risk.  Talk with your health care provider about your test results, treatment options, and if necessary, the need for more tests. Vaccines Your health care provider may recommend certain vaccines, such as:  Influenza vaccine. This is recommended every year.  Tetanus, diphtheria, and acellular pertussis (Tdap, Td) vaccine. You may need a Td booster every 10 years.  Varicella vaccine. You may need this if you have not been vaccinated.  Zoster vaccine. You may need this after age 54.  Measles, mumps, and rubella (MMR) vaccine. You may need at least one dose of MMR if you were born in 1957 or later. You may also need a second dose.  Pneumococcal 13-valent conjugate (PCV13) vaccine. One dose is recommended after age 88.  Pneumococcal polysaccharide (PPSV23) vaccine. One dose is recommended after age 39.  Meningococcal vaccine. You may need this if you have certain conditions.  Hepatitis A vaccine. You may need this if you have certain conditions or if you travel or work in places where you may be exposed to hepatitis A.  Hepatitis B vaccine. You may need this if you have certain conditions or if you travel or work in places where you may be exposed to hepatitis B.  Haemophilus influenzae type b (Hib) vaccine. You  may need this if you have certain risk factors.  Talk to your health care provider about which screenings and vaccines you need and how often you need them. This information is not intended to replace advice given to you by your health care provider. Make sure you discuss any questions you have with your health care provider. Document Released: 03/28/2015 Document Revised: 11/19/2015 Document Reviewed: 12/31/2014 Elsevier Interactive Patient Education  [employer].

## 2018-02-02 NOTE — Progress Notes (Signed)
Preventive and Wellness Office Visit     CC/Reason for Visit: Annual preventive and wellness exam  HPI: Jason Gould is a 67 y.o. male who is coming in today for the above mentioned reasons. Past Medical History is significant for: Hypertension, hyperlipidemia.  He is requesting prescription for Viagra, other than this he has had no acute issues since last visit 1 year ago.  Health Maintenance:  -Colon Cancer: 2017, due for repeat in 2022 -Lung Ca (ages 24-80 with 49 pack-yr history or quit past 15 yrs): Never smoker -DEXA: only if high risk (low BMI, ETOH, smoker, steroid use, previous fractures, h/o falls: No -Vaccines: Tdap (due today), PCV13 (yes), PPSV23 (yes), RZV (yes), MMR if born after 1957 if titers negative (no) -HIV: Today -HIV PrEP if high risk: No -BP: On treatment -DM: Fasting blood sugar today -Hep C (adults born between [birth year]-1965): Done, negative -AAA: Never smoker -Tobacco cessation: Never smoker -Healthy Diet and Exercise: Discussed mediterranean diet and at least 30 minutes of aerobic exercise daily. -He gets routine eye and dental care.    Past Medical/Surgical History: Past Medical History:  Diagnosis Date  . Allergy    seasonal  . Hx of colonic polyps   . Hyperlipidemia   . Hypertension     Past Surgical History:  Procedure Laterality Date  . COLONOSCOPY    . TONSILLECTOMY    . VASECTOMY      Social History:  reports that he has never smoked. He has never used smokeless tobacco. He reports that he drinks about 2.0 standard drinks of alcohol per week. He reports that he does not use drugs.  Allergies: No Known Allergies  Family History:  Family History  Problem Relation Age of Onset  . Hyperlipidemia Mother   . Colon cancer Mother   . Hypertension Father   . Hyperlipidemia Father   . Colon cancer Sister   . Colon polyps Neg Hx   . Esophageal cancer Neg Hx   . Rectal cancer Neg Hx   . Stomach cancer Neg Hx      Current  Outpatient Medications:  .  atorvastatin (LIPITOR) 20 MG tablet, TAKE 1 TABLET BY MOUTH DAILY., Disp: 31 tablet, Rfl: 0 .  chlorthalidone (HYGROTON) 25 MG tablet, Take 1 tablet (25 mg total) daily by mouth., Disp: 100 tablet, Rfl: 4 .  metoprolol succinate (TOPROL-XL) 25 MG 24 hr tablet, TAKE 1 TABLET ONCE DAILY., Disp: 31 tablet, Rfl: 0 .  sildenafil (VIAGRA) 25 MG tablet, Take 1 tablet (25 mg total) by mouth daily as needed for erectile dysfunction., Disp: 15 tablet, Rfl: 3  Review of Systems:  Constitutional: Denies fever, chills, diaphoresis, appetite change and fatigue.  HEENT: Denies photophobia, eye pain, redness, hearing loss, ear pain, congestion, sore throat, rhinorrhea, sneezing, mouth sores, trouble swallowing, neck pain, neck stiffness and tinnitus.   Respiratory: Denies SOB, DOE, cough, chest tightness,  and wheezing.   Cardiovascular: Denies chest pain, palpitations and leg swelling.  Gastrointestinal: Denies nausea, vomiting, abdominal pain, diarrhea, constipation, blood in stool and abdominal distention.  Genitourinary: Denies dysuria, urgency, frequency, hematuria, flank pain and difficulty urinating.  Endocrine: Denies: hot or cold intolerance, sweats, changes in hair or nails, polyuria, polydipsia. Musculoskeletal: Denies myalgias, back pain, joint swelling, arthralgias and gait problem.  Skin: Denies pallor, rash and wound.  Neurological: Denies dizziness, seizures, syncope, weakness, light-headedness, numbness and headaches.  Hematological: Denies adenopathy. Easy bruising, personal or family bleeding history  Psychiatric/Behavioral: Denies suicidal ideation, mood  changes, confusion, nervousness, sleep disturbance and agitation    Physical Exam: Vitals:   02/02/18 1506  BP: 120/78  Pulse: 60  Temp: 98.1 F (36.7 C)  TempSrc: Oral  SpO2: 97%  Weight: 205 lb 3.2 oz (93.1 kg)  Height: 6' (1.829 m)    Body mass index is 27.83 kg/m.   Constitutional: NAD, calm,  comfortable Eyes: PERRL, lids and conjunctivae normal ENMT: Mucous membranes are moist. Posterior pharynx clear of any exudate or lesions. Normal dentition. Tympanic membrane is pearly white, no erythema or bulging bilaterally, in the left ear there seems to be a piece of his hearing aid that is left behind. Neck: normal, supple, no masses, no thyromegaly Respiratory: clear to auscultation bilaterally, no wheezing, no crackles. Normal respiratory effort. No accessory muscle use.  Cardiovascular: Regular rate and rhythm, no murmurs / rubs / gallops. No extremity edema. 2+ pedal pulses. No carotid bruits.  Abdomen: no tenderness, no masses palpated. No hepatosplenomegaly. Bowel sounds positive.  Musculoskeletal: no clubbing / cyanosis. No joint deformity upper and lower extremities. Good ROM, no contractures. Normal muscle tone.  Skin: no rashes, lesions, ulcers. No induration Neurologic: CN 2-12 grossly intact. Sensation intact, DTR normal. Strength 5/5 in all 4.  Psychiatric: Normal judgment and insight. Alert and oriented x 3. Normal mood.    Impression and Plan:  Encounter for preventive health examination -All health maintenance issues have been addressed as above. -He will return in 1 year for repeat preventive health exam.  Hyperlipidemia, unspecified hyperlipidemia type -Lipids to be drawn today. -On atorvastatin 20 mg daily  Essential hypertension, benign -Well-controlled. -On chlorthalidone 25, metoprolol 25 -He is not taking a daily aspirin.  Erectile dysfunction -Will give 15 tablets of Viagra with 3 refills.    Patient Instructions  BEFORE YOU LEAVE: -Labs today. -Tetanus booster  -follow up: With me next year for your annual physical.     Preventive Care 65 Years and Older, Male Preventive care refers to lifestyle choices and visits with your health care provider that can promote health and wellness. What does preventive care include?  A yearly physical  exam. This is also called an annual well check.  Dental exams once or twice a year.  Routine eye exams. Ask your health care provider how often you should have your eyes checked.  Personal lifestyle choices, including: ? Daily care of your teeth and gums. ? Regular physical activity. ? Eating a healthy diet. ? Avoiding tobacco and drug use. ? Limiting alcohol use. ? Practicing safe sex. ? Taking low doses of aspirin every day. ? Taking vitamin and mineral supplements as recommended by your health care provider. What happens during an annual well check? The services and screenings done by your health care provider during your annual well check will depend on your age, overall health, lifestyle risk factors, and family history of disease. Counseling Your health care provider may ask you questions about your:  Alcohol use.  Tobacco use.  Drug use.  Emotional well-being.  Home and relationship well-being.  Sexual activity.  Eating habits.  History of falls.  Memory and ability to understand (cognition).  Work and work Statistician.  Screening You may have the following tests or measurements:  Height, weight, and BMI.  Blood pressure.  Lipid and cholesterol levels. These may be checked every 5 years, or more frequently if you are over 47 years old.  Skin check.  Lung cancer screening. You may have this screening every year starting at age 85  if you have a 30-pack-year history of smoking and currently smoke or have quit within the past 15 years.  Fecal occult blood test (FOBT) of the stool. You may have this test every year starting at age 28.  Flexible sigmoidoscopy or colonoscopy. You may have a sigmoidoscopy every 5 years or a colonoscopy every 10 years starting at age 25.  Prostate cancer screening. Recommendations will vary depending on your family history and other risks.  Hepatitis C blood test.  Hepatitis B blood test.  Sexually transmitted disease  (STD) testing.  Diabetes screening. This is done by checking your blood sugar (glucose) after you have not eaten for a while (fasting). You may have this done every 1-3 years.  Abdominal aortic aneurysm (AAA) screening. You may need this if you are a current or former smoker.  Osteoporosis. You may be screened starting at age 88 if you are at high risk.  Talk with your health care provider about your test results, treatment options, and if necessary, the need for more tests. Vaccines Your health care provider may recommend certain vaccines, such as:  Influenza vaccine. This is recommended every year.  Tetanus, diphtheria, and acellular pertussis (Tdap, Td) vaccine. You may need a Td booster every 10 years.  Varicella vaccine. You may need this if you have not been vaccinated.  Zoster vaccine. You may need this after age 46.  Measles, mumps, and rubella (MMR) vaccine. You may need at least one dose of MMR if you were born in 1957 or later. You may also need a second dose.  Pneumococcal 13-valent conjugate (PCV13) vaccine. One dose is recommended after age 63.  Pneumococcal polysaccharide (PPSV23) vaccine. One dose is recommended after age 90.  Meningococcal vaccine. You may need this if you have certain conditions.  Hepatitis A vaccine. You may need this if you have certain conditions or if you travel or work in places where you may be exposed to hepatitis A.  Hepatitis B vaccine. You may need this if you have certain conditions or if you travel or work in places where you may be exposed to hepatitis B.  Haemophilus influenzae type b (Hib) vaccine. You may need this if you have certain risk factors.  Talk to your health care provider about which screenings and vaccines you need and how often you need them. This information is not intended to replace advice given to you by your health care provider. Make sure you discuss any questions you have with your health care  provider. Document Released: 03/28/2015 Document Revised: 11/19/2015 Document Reviewed: 12/31/2014 Elsevier Interactive Patient Education  2018 [place], MD [place] Jacklynn Ganong

## 2018-02-03 LAB — CBC WITH DIFFERENTIAL/PLATELET
BASOS PCT: 0.7 % (ref 0.0–3.0)
Basophils Absolute: 0.1 10*3/uL (ref 0.0–0.1)
EOS ABS: 0.2 10*3/uL (ref 0.0–0.7)
Eosinophils Relative: 2 % (ref 0.0–5.0)
HCT: 44.6 % (ref 39.0–52.0)
HEMOGLOBIN: 15.2 g/dL (ref 13.0–17.0)
LYMPHS ABS: 1.9 10*3/uL (ref 0.7–4.0)
Lymphocytes Relative: 23.4 % (ref 12.0–46.0)
MCHC: 34.1 g/dL (ref 30.0–36.0)
MCV: 88.7 fl (ref 78.0–100.0)
Monocytes Absolute: 0.8 10*3/uL (ref 0.1–1.0)
Monocytes Relative: 9.5 % (ref 3.0–12.0)
NEUTROS PCT: 64.4 % (ref 43.0–77.0)
Neutro Abs: 5.2 10*3/uL (ref 1.4–7.7)
Platelets: 223 10*3/uL (ref 150.0–400.0)
RBC: 5.03 Mil/uL (ref 4.22–5.81)
RDW: 13.1 % (ref 11.5–15.5)
WBC: 8.1 10*3/uL (ref 4.0–10.5)

## 2018-02-03 LAB — LIPID PANEL
Cholesterol: 151 mg/dL (ref 0–200)
HDL: 41.6 mg/dL (ref >39.00)
LDL Cholesterol: 87 mg/dL (ref 0–99)
NONHDL: 109.69
Total CHOL/HDL Ratio: 4
Triglycerides: 112 mg/dL (ref 0.0–149.0)
VLDL: 22.4 mg/dL (ref 0.0–40.0)

## 2018-02-03 LAB — COMPREHENSIVE METABOLIC PANEL
ALBUMIN: 4.8 g/dL (ref 3.5–5.2)
ALK PHOS: 58 U/L (ref 39–117)
ALT: 23 U/L (ref 0–53)
AST: 21 U/L (ref 0–37)
BILIRUBIN TOTAL: 0.7 mg/dL (ref 0.2–1.2)
BUN: 13 mg/dL (ref 6–23)
CALCIUM: 9.7 mg/dL (ref 8.4–10.5)
CO2: 31 meq/L (ref 19–32)
CREATININE: 1.11 mg/dL (ref 0.40–1.50)
Chloride: 101 mEq/L (ref 96–112)
GFR: 70.47 mL/min (ref >60.00)
Glucose, Bld: 85 mg/dL (ref 70–99)
Potassium: 3.7 mEq/L (ref 3.5–5.1)
SODIUM: 141 meq/L (ref 135–145)
TOTAL PROTEIN: 6.7 g/dL (ref 6.0–8.3)

## 2018-02-03 LAB — TSH: TSH: 1.9 u[IU]/mL (ref 0.35–4.50)

## 2018-02-03 LAB — VITAMIN B12: VITAMIN B 12: 272 pg/mL (ref 211–911)

## 2018-02-03 LAB — HIV ANTIBODY (ROUTINE TESTING W REFLEX): HIV: NONREACTIVE

## 2018-02-03 LAB — VITAMIN D 25 HYDROXY (VIT D DEFICIENCY, FRACTURES): VITD: 57.69 ng/mL (ref 30.00–100.00)

## 2018-02-05 ENCOUNTER — Encounter (HOSPITAL_COMMUNITY): Payer: Self-pay | Admitting: Physician Assistant

## 2018-02-05 ENCOUNTER — Ambulatory Visit (HOSPITAL_COMMUNITY)
Admission: EM | Admit: 2018-02-05 | Discharge: 2018-02-05 | Disposition: A | Discharge status: Home / Self Care | Payer: 59 | Attending: Family Medicine | Admitting: Family Medicine

## 2018-02-05 DIAGNOSIS — T162XXA Foreign body in left ear, initial encounter: Secondary | ICD-10-CM | POA: Diagnosis not present

## 2018-02-05 MED ORDER — NEOMYCIN-POLYMYXIN-HC 3.5-10000-1 OT SUSP: 4.0000 [drp] | Freq: Four times a day (QID) | OTIC | 0 refills | Status: AC

## 2018-02-05 NOTE — ED Triage Notes (Signed)
Linward HeadlandAmy Yu, PA in room and triaging pt.

## 2018-02-05 NOTE — ED Provider Notes (Signed)
[facility]    CSN: [id number] Arrival date & time: 02/05/18  1129     History   Chief Complaint No chief complaint on file.   HPI Jason Gould is a 67 y.o. male.   67 year old male with history of HTN, HLD, comes in for foreign body in left ear canal.  States saw primary care 3 to 4 days ago, and found a foreign body in the ear.  Primary care did not have the right equipment to remove foreign body, and therefore came in for evaluation.  He thinks foreign body may have been there for the past few weeks.  Denies ear drainage, ear pain.  Thinks the foreign body is the ear bud for his hearing aid.     Past Medical History:  Diagnosis Date  . Allergy    seasonal  . Hx of colonic polyps   . Hyperlipidemia   . Hypertension     Patient Active Problem List   Diagnosis Date Noted  . Routine general medical examination at a health care facility 06/18/2015  . Palpitations 03/28/2014  . Essential hypertension, benign 05/09/2012  . Hyperlipidemia 01/10/2008    Past Surgical History:  Procedure Laterality Date  . COLONOSCOPY    . TONSILLECTOMY    . VASECTOMY         Home Medications    Prior to Admission medications   Medication Sig Start Date End Date Taking? Authorizing Provider  atorvastatin (LIPITOR) 20 MG tablet TAKE 1 TABLET BY MOUTH DAILY. 01/16/18   Roderick Pee, MD  chlorthalidone (HYGROTON) 25 MG tablet Take 1 tablet (25 mg total) daily by mouth. 01/24/17   Roderick Pee, MD  metoprolol succinate (TOPROL-XL) 25 MG 24 hr tablet TAKE 1 TABLET ONCE DAILY. 01/16/18   Roderick Pee, MD  neomycin-polymyxin-hydrocortisone (CORTISPORIN) 3.5-10000-1 OTIC suspension Place 4 drops into the left ear 4 (four) times daily for 7 days. 02/05/18 02/12/18  Belinda Fisher, PA-C  sildenafil (VIAGRA) 25 MG tablet Take 1 tablet (25 mg total) by mouth daily as needed for erectile dysfunction. 02/02/18   Henderson Cloud, MD    Family History Family History    Problem Relation Age of Onset  . Hyperlipidemia Mother   . Colon cancer Mother   . Hypertension Father   . Hyperlipidemia Father   . Colon cancer Sister   . Colon polyps Neg Hx   . Esophageal cancer Neg Hx   . Rectal cancer Neg Hx   . Stomach cancer Neg Hx     Social History Social History   Tobacco Use  . Smoking status: Never Smoker  . Smokeless tobacco: Never Used  Substance Use Topics  . Alcohol use: Yes    Alcohol/week: 2.0 standard drinks    Types: 2 Cans of beer per week    Comment: 2-3 beers a week   . Drug use: No     Allergies   Patient has no known allergies.   Review of Systems Review of Systems  Reason unable to perform ROS: See HPI as above.     Physical Exam Triage Vital Signs ED Triage Vitals  Enc Vitals Group     BP 02/05/18 1143 121/77     Pulse Rate 02/05/18 1143 72     Resp 02/05/18 1143 16     Temp 02/05/18 1143 98.1 F (36.7 C)     Temp Source 02/05/18 1143 Oral     SpO2 02/05/18 1143 100 %  Weight --      Height --      Head Circumference --      Peak Flow --      Pain Score 02/05/18 1148 0     Pain Loc --      Pain Edu? --      Excl. in GC? --    No data found.  Updated Vital Signs BP 121/77 (BP Location: Right Arm)   Pulse 72   Temp 98.1 F (36.7 C) (Oral)   Resp 16   SpO2 100%   Physical Exam  Constitutional: He is oriented to person, place, and time. He appears well-developed and well-nourished. No distress.  HENT:  Head: Normocephalic and atraumatic.  Clear/yellow ear bud in left ear canal. No obvious swelling, erythema, drainage. No foul odor.   Eyes: Pupils are equal, round, and reactive to light. Conjunctivae are normal.  Neurological: He is alert and oriented to person, place, and time.  Skin: He is not diaphoretic.     UC Treatments / Results  Labs (all labs ordered are listed, but only abnormal results are displayed) Labs Reviewed - No data to display  EKG None  Radiology No results  found.  Procedures Foreign Body Removal Date/Time: 02/05/2018 12:15 PM Performed by: Belinda FisherYu, Amy V, PA-C Authorized by: Elvina SidleLauenstein, Kurt, MD   Consent:    Consent obtained:  Verbal   Consent given by:  Patient   Risks discussed:  Pain, worsening of condition, infection and incomplete removal   Alternatives discussed:  Referral Location:    Location:  Ear   Ear location:  L ear   Depth: ear canal.   Tendon involvement:  None Pre-procedure details:    Imaging:  None   Neurovascular status: intact   Anesthesia (see MAR for exact dosages):    Anesthesia method:  None Procedure type:    Procedure complexity:  Simple Comments:     Foreign body visualized with otoscope. Forceps removed intact ear bud. No bleeding or pain. Ear canal post removal without swelling, erythema, drainage seen. No other foreign body seen. TM pearly grey without erythema, bulging, perforation. Patient tolerated procedure well without immediate complications.    (including critical care time)  Medications Ordered in UC Medications - No data to display  Initial Impression / Assessment and Plan / UC Course  I have reviewed the triage vital signs and the nursing notes.  Pertinent labs & imaging results that were available during my care of the patient were reviewed by me and considered in my medical decision making (see chart for details).    Foreign body removed. Patient without ear pain or discomfort. Will provide Rx of cortisporin, can use if starting to have ear pain, drainage, swelling given few weeks of foreign body in ear. Recheck as needed.  Final Clinical Impressions(s) / UC Diagnoses   Final diagnoses:  Foreign body of left ear, initial encounter    ED Prescriptions    Medication Sig Dispense Auth. Provider   neomycin-polymyxin-hydrocortisone (CORTISPORIN) 3.5-10000-1 OTIC suspension Place 4 drops into the left ear 4 (four) times daily for 7 days. 5.6 mL Threasa AlphaYu, Amy V, PA-C        Yu, Amy V,  [place] 02/05/18 1217

## 2018-02-05 NOTE — Discharge Instructions (Signed)
Foreign body removed from ear. If starting to have pain/drainage, can start cortisporin as directed. Otherwise, recheck as needed.

## 2018-02-28 ENCOUNTER — Other Ambulatory Visit: Payer: Self-pay | Admitting: *Deleted

## 2018-02-28 MED ORDER — METOPROLOL SUCCINATE ER 25 MG PO TB24: 25.0000 mg | ORAL_TABLET | Freq: Every day | ORAL | 1 refills | Status: DC

## 2018-03-02 ENCOUNTER — Other Ambulatory Visit: Payer: Self-pay | Admitting: Internal Medicine

## 2018-03-02 MED ORDER — ATORVASTATIN CALCIUM 20 MG PO TABS: 20.0000 mg | ORAL_TABLET | Freq: Every day | ORAL | 3 refills | Status: DC

## 2018-03-02 NOTE — Telephone Encounter (Signed)
Requested Prescriptions  Pending Prescriptions Disp Refills  . atorvastatin (LIPITOR) 20 MG tablet 90 tablet 3    Sig: Take 1 tablet (20 mg total) by mouth daily.     Cardiovascular:  Antilipid - Statins Passed - 03/02/2018  7:44 AM      Passed - Total Cholesterol in normal range and within 360 days    Cholesterol  Date Value Ref Range Status  02/02/2018 151 0 - 200 mg/dL Final    Comment:    ATP III Classification       Desirable:  < 200 mg/dL               Borderline High:  200 - 239 mg/dL          High:  > = 409240 mg/dL         Passed - LDL in normal range and within 360 days    LDL Cholesterol  Date Value Ref Range Status  02/02/2018 87 0 - 99 mg/dL Final         Passed - HDL in normal range and within 360 days    HDL  Date Value Ref Range Status  02/02/2018 41.60 >39.00 mg/dL Final         Passed - Triglycerides in normal range and within 360 days    Triglycerides  Date Value Ref Range Status  02/02/2018 112.0 0.0 - 149.0 mg/dL Final    Comment:    Normal:  <150 mg/dLBorderline High:  150 - 199 mg/dL         Passed - Patient is not pregnant      Passed - Valid encounter within last 12 months    Recent Outpatient Visits          4 weeks ago [facility] for preventive health [facility] Limmie PatriciaEstela Y, MD   1 year ago Need for prophylactic vaccination and inoculation against influenza   [facility] Eugenio HoesJeffrey A, MD   2 years ago Essential hypertension   [facility] Eugenio HoesJeffrey A, MD   3 years ago Palpitations   [facility] Eugenio HoesJeffrey A, MD   4 years ago Hyperlipidemia   [facility], Eugenio HoesJeffrey A, MD

## 2018-03-02 NOTE — Telephone Encounter (Signed)
Copied from CRM [id number]. Topic: Quick Communication - Rx Refill/Question >> Mar 02, 2018  7:38 AM Tamela OddiHarris, Hiawatha Merriott J wrote: Medication: atorvastatin (LIPITOR) 20 MG tablet  Patient  called to request a refill for the above medication  Preferred Pharmacy (with phone number or street name): [facility] [place] [address] [phone] (Phone) [phone] (Fax)

## 2018-06-20 ENCOUNTER — Other Ambulatory Visit: Payer: Self-pay | Admitting: Internal Medicine

## 2018-07-31 ENCOUNTER — Other Ambulatory Visit: Payer: Self-pay | Admitting: Internal Medicine

## 2018-08-18 ENCOUNTER — Other Ambulatory Visit: Payer: Self-pay | Admitting: Internal Medicine

## 2018-08-22 MED ORDER — METOPROLOL SUCCINATE ER 25 MG PO TB24: 25.0000 mg | ORAL_TABLET | Freq: Every day | ORAL | 0 refills | Status: DC

## 2018-10-11 ENCOUNTER — Other Ambulatory Visit: Payer: Self-pay | Admitting: Internal Medicine

## 2018-10-27 ENCOUNTER — Other Ambulatory Visit: Payer: Self-pay | Admitting: Internal Medicine

## 2018-11-11 IMAGING — CT CT HEAD W/O CM
4 series · 17 of 47 positions shown, 19 images · non-contrast
Comparison: None.

CLINICAL DATA: Severe headache

EXAM:
CT HEAD WITHOUT CONTRAST
TECHNIQUE: Contiguous axial images were obtained from the base of the skull
through the vertex without intravenous contrast.

[Series 2: head w/o · axial · non-contrast · 0.45mm/px · z∈[-140,-20]mm · 7 of 32 slices shown, 9 images]
[im 4/32  brain]
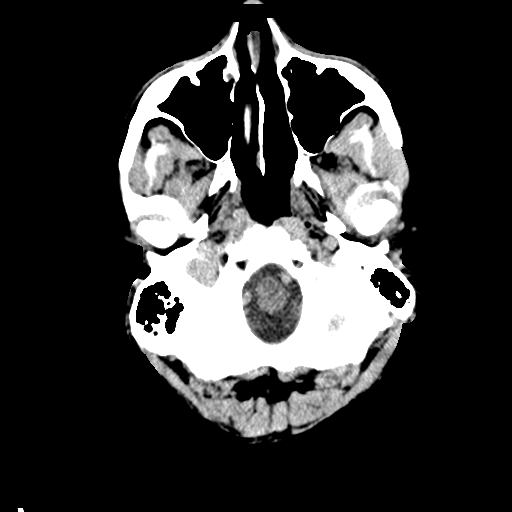
[im 4/32  bone]
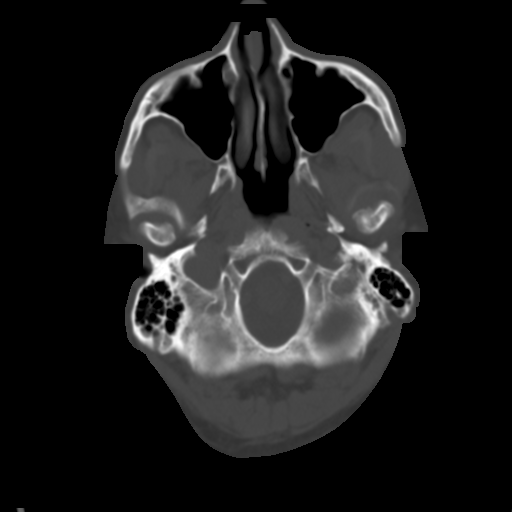
[im 8/32  brain]
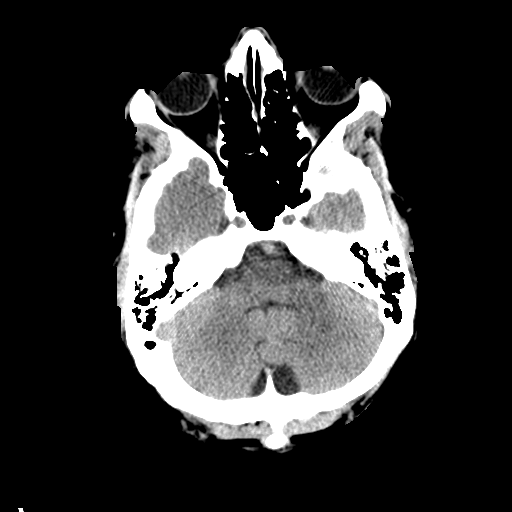
[im 12/32  brain]
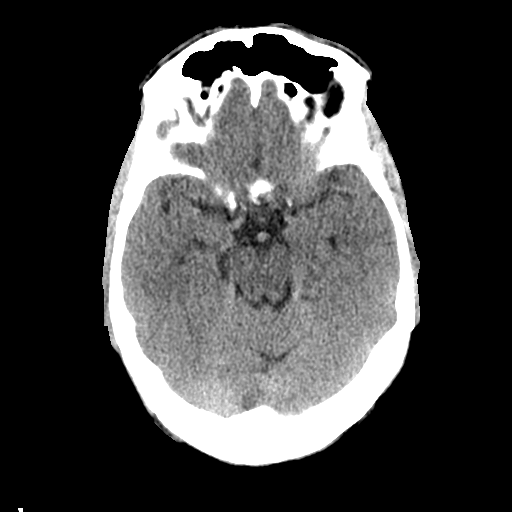
[im 16/32  brain]
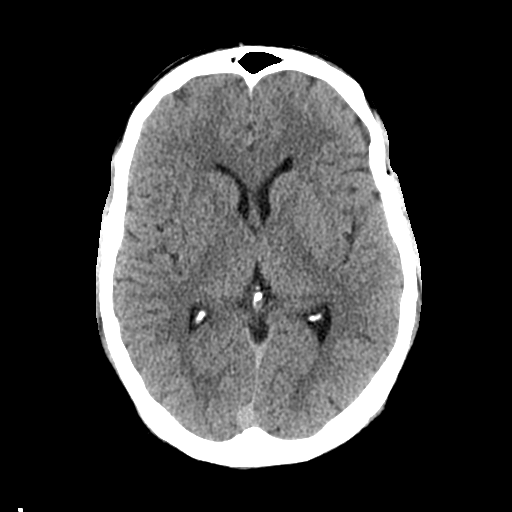
[im 20/32  brain]
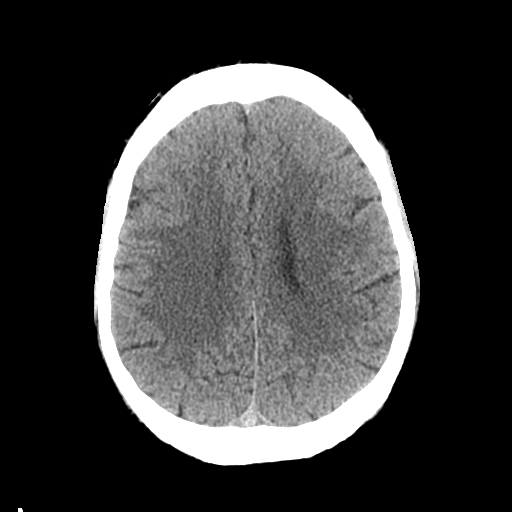
[im 20/32  bone]
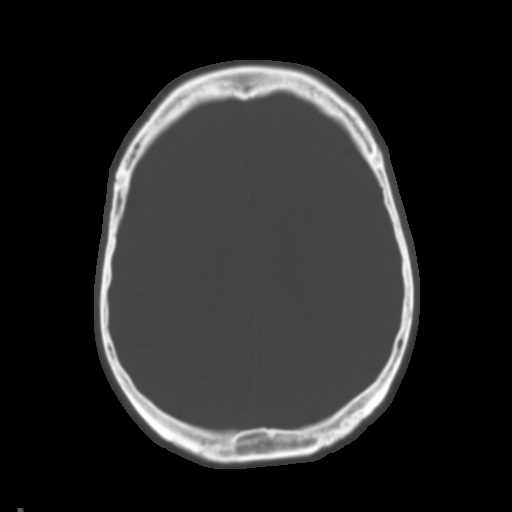
[im 24/32  brain]
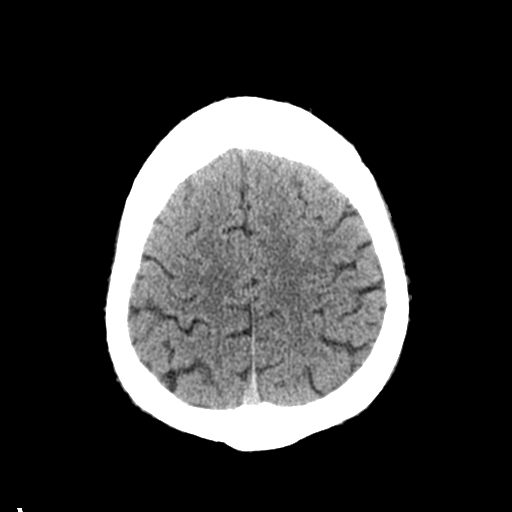
[im 28/32  brain]
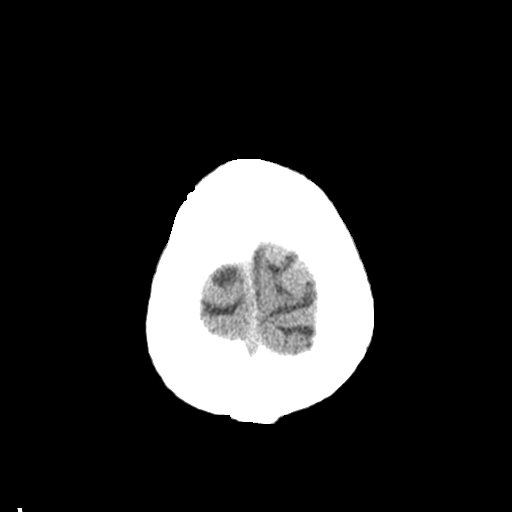

[Series 3: bone windows · axial · 0.45mm/px · z∈[-141,-85]mm · 4 of 80 slices shown]
[im 8/80  bone]
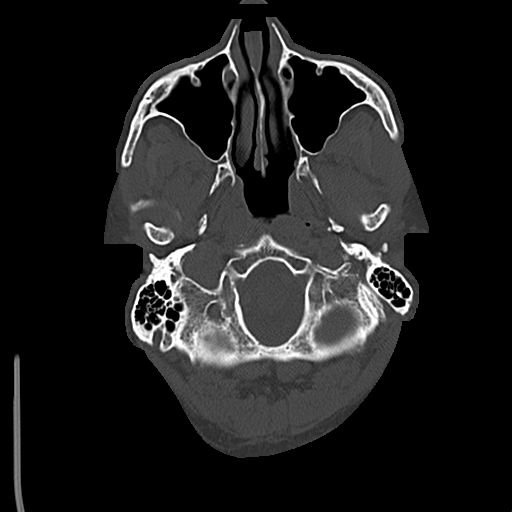
[im 16/80  bone]
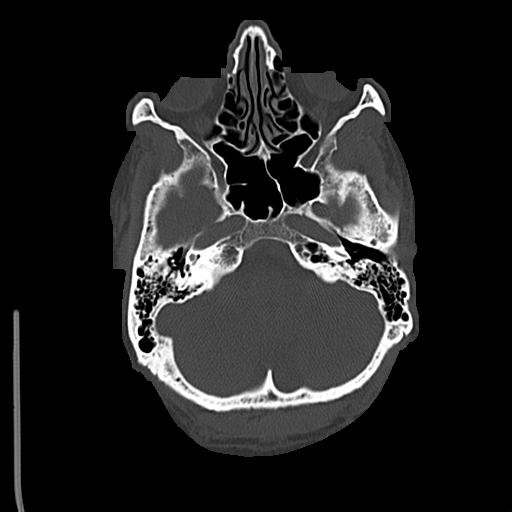
[im 24/80  bone]
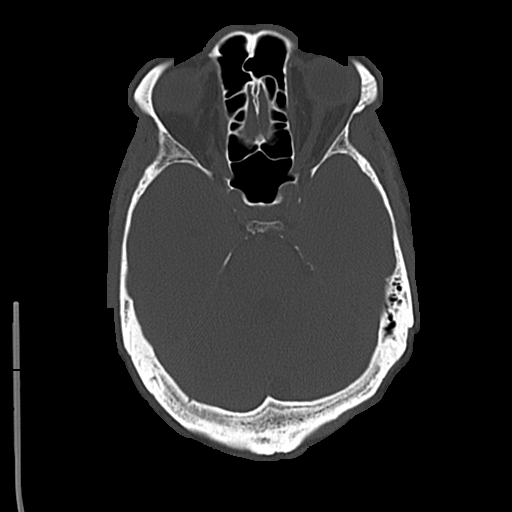
[im 36/80  bone]
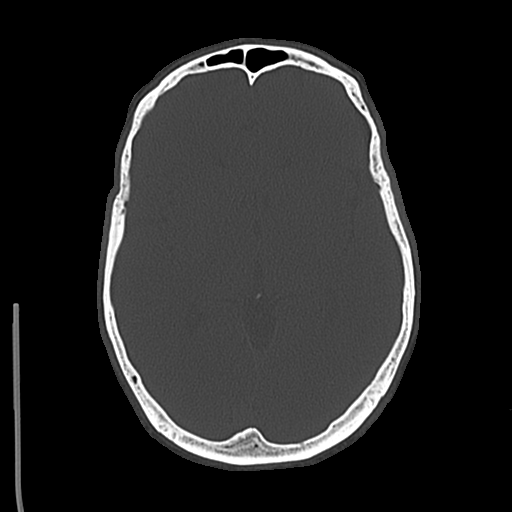

[Series 5: coronal · coronal · 0.32mm/px · 3 of 70 slices shown]
[im 24/70  brain]
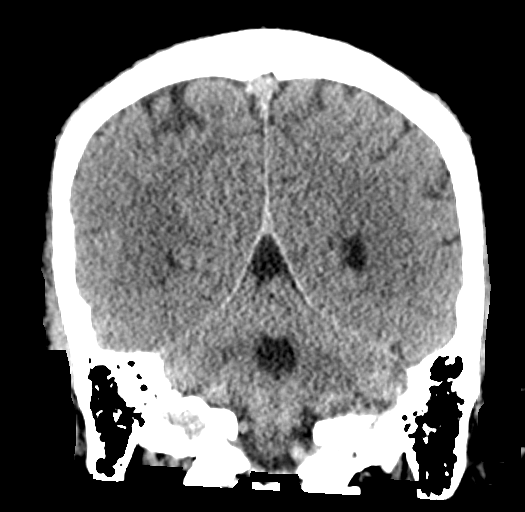
[im 31/70  brain]
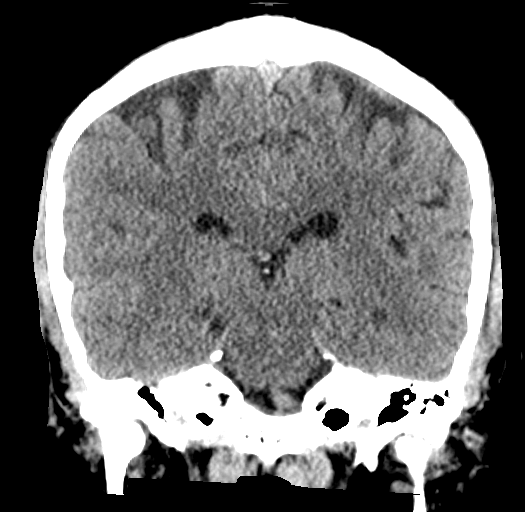
[im 39/70  brain]
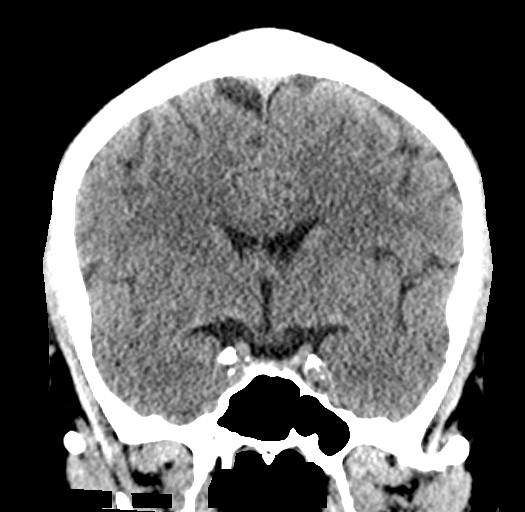

[Series 6: sagittal · sagittal · 0.32mm/px · 3 of 56 slices shown]
[im 19/56  brain]
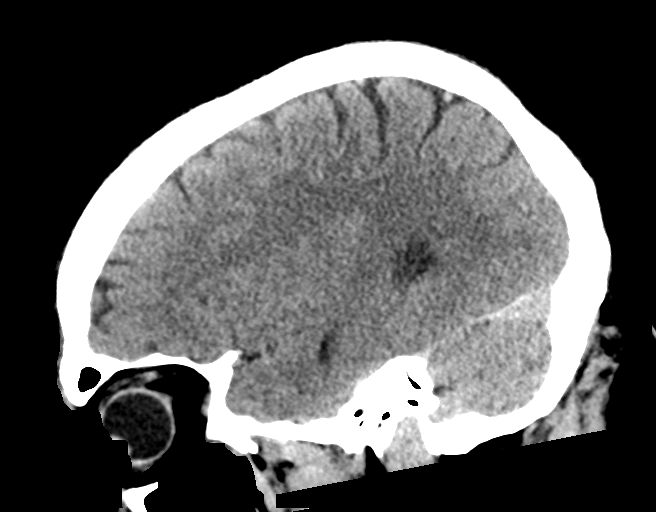
[im 28/56  brain]
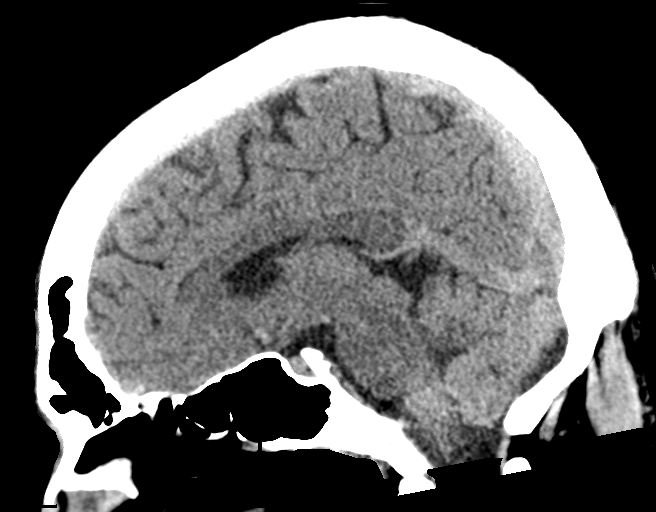
[im 37/56  brain]
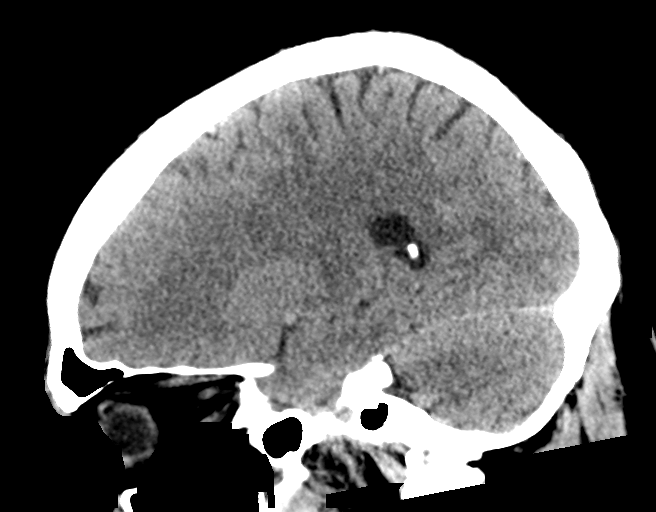

[17 of 47 positions shown; findings below may reference images not displayed]

FINDINGS: Brain: No mass lesion, intraparenchymal hemorrhage or extra-axial
collection. No evidence of acute cortical infarct. Brain parenchyma
and CSF-containing spaces are normal for age.

Vascular: No hyperdense vessel or unexpected calcification.

Skull: Normal visualized skull base, calvarium and extracranial soft
tissues.

Sinuses/Orbits: No sinus fluid levels or advanced mucosal
thickening. No mastoid effusion. Normal orbits.
IMPRESSION: Normal head CT.

## 2019-01-04 ENCOUNTER — Encounter: Payer: 59 | Admitting: Internal Medicine

## 2019-01-10 ENCOUNTER — Ambulatory Visit (INDEPENDENT_AMBULATORY_CARE_PROVIDER_SITE_OTHER): Payer: 59 | Admitting: Psychology

## 2019-01-10 DIAGNOSIS — F4323 Adjustment disorder with mixed anxiety and depressed mood: Secondary | ICD-10-CM | POA: Diagnosis not present

## 2019-01-17 ENCOUNTER — Other Ambulatory Visit: Payer: Self-pay

## 2019-01-17 DIAGNOSIS — Z20822 Contact with and (suspected) exposure to covid-19: Secondary | ICD-10-CM

## 2019-01-18 LAB — NOVEL CORONAVIRUS, NAA: SARS-CoV-2, NAA: NOT DETECTED

## 2019-01-24 ENCOUNTER — Encounter: Payer: Self-pay | Admitting: Internal Medicine

## 2019-01-24 ENCOUNTER — Ambulatory Visit (INDEPENDENT_AMBULATORY_CARE_PROVIDER_SITE_OTHER): Payer: BC Managed Care – PPO | Admitting: Internal Medicine

## 2019-01-24 ENCOUNTER — Other Ambulatory Visit: Payer: Self-pay

## 2019-01-24 VITALS — BP 120/78 | HR 54 | Temp 97.6°F | Ht 71.75 in | Wt 201.2 lb

## 2019-01-24 DIAGNOSIS — E785 Hyperlipidemia, unspecified: Secondary | ICD-10-CM | POA: Diagnosis not present

## 2019-01-24 DIAGNOSIS — Z0000 Encounter for general adult medical examination without abnormal findings: Secondary | ICD-10-CM

## 2019-01-24 DIAGNOSIS — I10 Essential (primary) hypertension: Secondary | ICD-10-CM

## 2019-01-24 DIAGNOSIS — N529 Male erectile dysfunction, unspecified: Secondary | ICD-10-CM

## 2019-01-24 LAB — COMPREHENSIVE METABOLIC PANEL
ALT: 41 U/L (ref 0–53)
AST: 30 U/L (ref 0–37)
Albumin: 4.7 g/dL (ref 3.5–5.2)
Alkaline Phosphatase: 57 U/L (ref 39–117)
BUN: 12 mg/dL (ref 6–23)
CO2: 32 mEq/L (ref 19–32)
Calcium: 9.3 mg/dL (ref 8.4–10.5)
Chloride: 100 mEq/L (ref 96–112)
Creatinine, Ser: 1.09 mg/dL (ref 0.40–1.50)
GFR: 67.51 mL/min (ref >60.00)
Glucose, Bld: 99 mg/dL (ref 70–99)
Potassium: 3.7 mEq/L (ref 3.5–5.1)
Sodium: 139 mEq/L (ref 135–145)
Total Bilirubin: 0.5 mg/dL (ref 0.2–1.2)
Total Protein: 6.7 g/dL (ref 6.0–8.3)

## 2019-01-24 LAB — LIPID PANEL
Cholesterol: 154 mg/dL (ref 0–200)
HDL: 39.9 mg/dL (ref >39.00)
LDL Cholesterol: 87 mg/dL (ref 0–99)
NonHDL: 114.23
Total CHOL/HDL Ratio: 4
Triglycerides: 134 mg/dL (ref 0.0–149.0)
VLDL: 26.8 mg/dL (ref 0.0–40.0)

## 2019-01-24 LAB — CBC WITH DIFFERENTIAL/PLATELET
Basophils Absolute: 0.1 10*3/uL (ref 0.0–0.1)
Basophils Relative: 1 % (ref 0.0–3.0)
Eosinophils Absolute: 0.2 10*3/uL (ref 0.0–0.7)
Eosinophils Relative: 2.4 % (ref 0.0–5.0)
HCT: 44.3 % (ref 39.0–52.0)
Hemoglobin: 15.2 g/dL (ref 13.0–17.0)
Lymphocytes Relative: 19.1 % (ref 12.0–46.0)
Lymphs Abs: 1.6 10*3/uL (ref 0.7–4.0)
MCHC: 34.2 g/dL (ref 30.0–36.0)
MCV: 88.6 fl (ref 78.0–100.0)
Monocytes Absolute: 0.7 10*3/uL (ref 0.1–1.0)
Monocytes Relative: 8.9 % (ref 3.0–12.0)
Neutro Abs: 5.6 10*3/uL (ref 1.4–7.7)
Neutrophils Relative %: 68.6 % (ref 43.0–77.0)
Platelets: 214 10*3/uL (ref 150.0–400.0)
RBC: 5 Mil/uL (ref 4.22–5.81)
RDW: 13.1 % (ref 11.5–15.5)
WBC: 8.2 10*3/uL (ref 4.0–10.5)

## 2019-01-24 LAB — TSH: TSH: 2.17 u[IU]/mL (ref 0.35–4.50)

## 2019-01-24 LAB — VITAMIN B12: Vitamin B-12: 416 pg/mL (ref 211–911)

## 2019-01-24 LAB — VITAMIN D 25 HYDROXY (VIT D DEFICIENCY, FRACTURES): VITD: 57.54 ng/mL (ref 30.00–100.00)

## 2019-01-24 LAB — HEMOGLOBIN A1C: Hgb A1c MFr Bld: 5.6 % (ref 4.6–6.5)

## 2019-01-24 MED ORDER — ATORVASTATIN CALCIUM 20 MG PO TABS: 20.0000 mg | ORAL_TABLET | Freq: Every day | ORAL | 3 refills | Status: DC

## 2019-01-24 MED ORDER — METOPROLOL SUCCINATE ER 25 MG PO TB24: 25.0000 mg | ORAL_TABLET | Freq: Every day | ORAL | 1 refills | Status: DC

## 2019-01-24 MED ORDER — SILDENAFIL CITRATE 25 MG PO TABS: 25.0000 mg | ORAL_TABLET | Freq: Every day | ORAL | 3 refills | Status: DC | PRN

## 2019-01-24 MED ORDER — CHLORTHALIDONE 25 MG PO TABS: 25.0000 mg | ORAL_TABLET | Freq: Every day | ORAL | 1 refills | Status: DC

## 2019-01-24 NOTE — Patient Instructions (Signed)
-Nice seeing you today!!  -Lab work today; will notify you once results are available.  -Remember to schedule annual eye exam.  -Schedule follow up in 1 year or sooner as needed.   Preventive Care 4 Years and Older, Male Preventive care refers to lifestyle choices and visits with your health care provider that can promote health and wellness. This includes:  A yearly physical exam. This is also called an annual well check.  Regular dental and eye exams.  Immunizations.  Screening for certain conditions.  Healthy lifestyle choices, such as diet and exercise. What can I expect for my preventive care visit? Physical exam Your health care provider will check:  Height and weight. These may be used to calculate body mass index (BMI), which is a measurement that tells if you are at a healthy weight.  Heart rate and blood pressure.  Your skin for abnormal spots. Counseling Your health care provider may ask you questions about:  Alcohol, tobacco, and drug use.  Emotional well-being.  Home and relationship well-being.  Sexual activity.  Eating habits.  History of falls.  Memory and ability to understand (cognition).  Work and work Statistician. What immunizations do I need?  Influenza (flu) vaccine  This is recommended every year. Tetanus, diphtheria, and pertussis (Tdap) vaccine  You may need a Td booster every 10 years. Varicella (chickenpox) vaccine  You may need this vaccine if you have not already been vaccinated. Zoster (shingles) vaccine  You may need this after age 51. Pneumococcal conjugate (PCV13) vaccine  One dose is recommended after age 69. Pneumococcal polysaccharide (PPSV23) vaccine  One dose is recommended after age 74. Measles, mumps, and rubella (MMR) vaccine  You may need at least one dose of MMR if you were born in 1957 or later. You may also need a second dose. Meningococcal conjugate (MenACWY) vaccine  You may need this if you have  certain conditions. Hepatitis A vaccine  You may need this if you have certain conditions or if you travel or work in places where you may be exposed to hepatitis A. Hepatitis B vaccine  You may need this if you have certain conditions or if you travel or work in places where you may be exposed to hepatitis B. Haemophilus influenzae type b (Hib) vaccine  You may need this if you have certain conditions. You may receive vaccines as individual doses or as more than one vaccine together in one shot (combination vaccines). Talk with your health care provider about the risks and benefits of combination vaccines. What tests do I need? Blood tests  Lipid and cholesterol levels. These may be checked every 5 years, or more frequently depending on your overall health.  Hepatitis C test.  Hepatitis B test. Screening  Lung cancer screening. You may have this screening every year starting at age 108 if you have a 30-pack-year history of smoking and currently smoke or have quit within the past 15 years.  Colorectal cancer screening. All adults should have this screening starting at age 28 and continuing until age 42. Your health care provider may recommend screening at age 27 if you are at increased risk. You will have tests every 1-10 years, depending on your results and the type of screening test.  Prostate cancer screening. Recommendations will vary depending on your family history and other risks.  Diabetes screening. This is done by checking your blood sugar (glucose) after you have not eaten for a while (fasting). You may have this done every 1-3  years.  Abdominal aortic aneurysm (AAA) screening. You may need this if you are a current or former smoker.  Sexually transmitted disease (STD) testing. Follow these instructions at home: Eating and drinking  Eat a diet that includes fresh fruits and vegetables, whole grains, lean protein, and low-fat dairy products. Limit your intake of foods with  high amounts of sugar, saturated fats, and salt.  Take vitamin and mineral supplements as recommended by your health care provider.  Do not drink alcohol if your health care provider tells you not to drink.  If you drink alcohol: ? Limit how much you have to 0-2 drinks a day. ? Be aware of how much alcohol is in your drink. In the [place] one drink equals one 12 oz bottle of beer (355 mL), one 5 oz glass of wine (148 mL), or one 1 oz glass of hard liquor (44 mL). Lifestyle  Take daily care of your teeth and gums.  Stay active. Exercise for at least 30 minutes on 5 or more days each week.  Do not use any products that contain nicotine or tobacco, such as cigarettes, e-cigarettes, and chewing tobacco. If you need help quitting, ask your health care provider.  If you are sexually active, practice safe sex. Use a condom or other form of protection to prevent STIs (sexually transmitted infections).  Talk with your health care provider about taking a low-dose aspirin or statin. What's next?  Visit your health care provider once a year for a well check visit.  Ask your health care provider how often you should have your eyes and teeth checked.  Stay up to date on all vaccines. This information is not intended to replace advice given to you by your health care provider. Make sure you discuss any questions you have with your health care provider. Document Released: 03/28/2015 Document Revised: 02/23/2018 Document Reviewed: 02/23/2018 Elsevier Patient Education  2020 [employer].

## 2019-01-24 NOTE — Progress Notes (Signed)
Established Patient Office Visit     CC/Reason for Visit: Annual preventive exam  HPI: Jason Gould is a 68 y.o. male who is coming in today for the above mentioned reasons. Past Medical History is significant for: Well-controlled hypertension and hyperlipidemia on a statin.  He has no complaints today.  He has routine dental care but not routine eye care, wears a hearing aid, is due for second pneumonia vaccine.  He had his first shingles vaccine and second will be due in a couple months.  His flu and tetanus are up-to-date.  He had a colonoscopy in 2017 and is a 5-year callback.   Past Medical/Surgical History: Past Medical History:  Diagnosis Date  . Allergy    seasonal  . Hx of colonic polyps   . Hyperlipidemia   . Hypertension     Past Surgical History:  Procedure Laterality Date  . COLONOSCOPY    . TONSILLECTOMY    . VASECTOMY      Social History:  reports that he has never smoked. He has never used smokeless tobacco. He reports current alcohol use of about 2.0 standard drinks of alcohol per week. He reports that he does not use drugs.  Allergies: No Known Allergies  Family History:  Family History  Problem Relation Age of Onset  . Hyperlipidemia Mother   . Colon cancer Mother   . Hypertension Father   . Hyperlipidemia Father   . Colon cancer Sister   . Colon polyps Neg Hx   . Esophageal cancer Neg Hx   . Rectal cancer Neg Hx   . Stomach cancer Neg Hx      Current Outpatient Medications:  .  atorvastatin (LIPITOR) 20 MG tablet, Take 1 tablet (20 mg total) by mouth daily., Disp: 90 tablet, Rfl: 3 .  chlorthalidone (HYGROTON) 25 MG tablet, Take 1 tablet (25 mg total) by mouth daily., Disp: 90 tablet, Rfl: 1 .  metoprolol succinate (TOPROL-XL) 25 MG 24 hr tablet, Take 1 tablet (25 mg total) by mouth daily., Disp: 90 tablet, Rfl: 1 .  sildenafil (VIAGRA) 25 MG tablet, Take 1 tablet (25 mg total) by mouth daily as needed for erectile dysfunction., Disp:  15 tablet, Rfl: 3  Review of Systems:  Constitutional: Denies fever, chills, diaphoresis, appetite change and fatigue.  HEENT: Denies photophobia, eye pain, redness, hearing loss, ear pain, congestion, sore throat, rhinorrhea, sneezing, mouth sores, trouble swallowing, neck pain, neck stiffness and tinnitus.   Respiratory: Denies SOB, DOE, cough, chest tightness,  and wheezing.   Cardiovascular: Denies chest pain, palpitations and leg swelling.  Gastrointestinal: Denies nausea, vomiting, abdominal pain, diarrhea, constipation, blood in stool and abdominal distention.  Genitourinary: Denies dysuria, urgency, frequency, hematuria, flank pain and difficulty urinating.  Endocrine: Denies: hot or cold intolerance, sweats, changes in hair or nails, polyuria, polydipsia. Musculoskeletal: Denies myalgias, back pain, joint swelling, arthralgias and gait problem.  Skin: Denies pallor, rash and wound.  Neurological: Denies dizziness, seizures, syncope, weakness, light-headedness, numbness and headaches.  Hematological: Denies adenopathy. Easy bruising, personal or family bleeding history  Psychiatric/Behavioral: Denies suicidal ideation, mood changes, confusion, nervousness, sleep disturbance and agitation    Physical Exam: Vitals:   01/24/19 1003  BP: 120/78  Pulse: (!) 54  Temp: 97.6 F (36.4 C)  TempSrc: Temporal  SpO2: 98%  Weight: 201 lb 3.2 oz (91.3 kg)  Height: 5' 11.75" (1.822 m)    Body mass index is 27.48 kg/m.   Constitutional: NAD, calm, comfortable Eyes: PERRL,  lids and conjunctivae normal ENMT: Mucous membranes are moist. Tympanic membrane is pearly white, no erythema or bulging. Neck: normal, supple, no masses, no thyromegaly Respiratory: clear to auscultation bilaterally, no wheezing, no crackles. Normal respiratory effort. No accessory muscle use.  Cardiovascular: Regular rate and rhythm, no murmurs / rubs / gallops. No extremity edema. 2+ pedal pulses. No carotid  bruits.  Abdomen: no tenderness, no masses palpated. No hepatosplenomegaly. Bowel sounds positive.  Musculoskeletal: no clubbing / cyanosis. No joint deformity upper and lower extremities. Good ROM, no contractures. Normal muscle tone.  Skin: no rashes, lesions, ulcers. No induration Neurologic: CN 2-12 grossly intact. Sensation intact, DTR normal. Strength 5/5 in all 4.  Psychiatric: Normal judgment and insight. Alert and oriented x 3. Normal mood.    Impression and Plan:  Encounter for preventive health examination -He has routine dental care, have advised routine eye care. -He is due for Prevnar but would like to defer.  His second shingles will be due in 2 months.  Otherwise immunizations are up-to-date. -Screening labs today. -Healthy lifestyle has been discussed in detail including 30 to 45 minutes 3 times a week of moderate intensity aerobic activity for cardiovascular health. -Colonoscopy in 2017 and is a 5-year callback. -We have discussed current prostate cancer screening guidelines, given he is low risk and had a normal PSA in 2018, we will defer this year.  Erectile dysfunction, unspecified erectile dysfunction type  -Refill sildenafil that he uses as needed.  Hyperlipidemia, unspecified hyperlipidemia type  -Last LDL was 87 in November 2019, recheck lipids today, continue Lipitor.  Essential hypertension, benign -Well-controlled on chlorthalidone and metoprolol.    Patient Instructions  -Nice seeing you today!!  -Lab work today; will notify you once results are available.  -Remember to schedule annual eye exam.  -Schedule follow up in 1 year or sooner as needed.   Preventive Care 82 Years and Older, Male Preventive care refers to lifestyle choices and visits with your health care provider that can promote health and wellness. This includes:  A yearly physical exam. This is also called an annual well check.  Regular dental and eye exams.  Immunizations.   Screening for certain conditions.  Healthy lifestyle choices, such as diet and exercise. What can I expect for my preventive care visit? Physical exam Your health care provider will check:  Height and weight. These may be used to calculate body mass index (BMI), which is a measurement that tells if you are at a healthy weight.  Heart rate and blood pressure.  Your skin for abnormal spots. Counseling Your health care provider may ask you questions about:  Alcohol, tobacco, and drug use.  Emotional well-being.  Home and relationship well-being.  Sexual activity.  Eating habits.  History of falls.  Memory and ability to understand (cognition).  Work and work Statistician. What immunizations do I need?  Influenza (flu) vaccine  This is recommended every year. Tetanus, diphtheria, and pertussis (Tdap) vaccine  You may need a Td booster every 10 years. Varicella (chickenpox) vaccine  You may need this vaccine if you have not already been vaccinated. Zoster (shingles) vaccine  You may need this after age 55. Pneumococcal conjugate (PCV13) vaccine  One dose is recommended after age 90. Pneumococcal polysaccharide (PPSV23) vaccine  One dose is recommended after age 102. Measles, mumps, and rubella (MMR) vaccine  You may need at least one dose of MMR if you were born in 1957 or later. You may also need a second dose. Meningococcal  conjugate (MenACWY) vaccine  You may need this if you have certain conditions. Hepatitis A vaccine  You may need this if you have certain conditions or if you travel or work in places where you may be exposed to hepatitis A. Hepatitis B vaccine  You may need this if you have certain conditions or if you travel or work in places where you may be exposed to hepatitis B. Haemophilus influenzae type b (Hib) vaccine  You may need this if you have certain conditions. You may receive vaccines as individual doses or as more than one vaccine  together in one shot (combination vaccines). Talk with your health care provider about the risks and benefits of combination vaccines. What tests do I need? Blood tests  Lipid and cholesterol levels. These may be checked every 5 years, or more frequently depending on your overall health.  Hepatitis C test.  Hepatitis B test. Screening  Lung cancer screening. You may have this screening every year starting at age 28 if you have a 30-pack-year history of smoking and currently smoke or have quit within the past 15 years.  Colorectal cancer screening. All adults should have this screening starting at age 15 and continuing until age 37. Your health care provider may recommend screening at age 90 if you are at increased risk. You will have tests every 1-10 years, depending on your results and the type of screening test.  Prostate cancer screening. Recommendations will vary depending on your family history and other risks.  Diabetes screening. This is done by checking your blood sugar (glucose) after you have not eaten for a while (fasting). You may have this done every 1-3 years.  Abdominal aortic aneurysm (AAA) screening. You may need this if you are a current or former smoker.  Sexually transmitted disease (STD) testing. Follow these instructions at home: Eating and drinking  Eat a diet that includes fresh fruits and vegetables, whole grains, lean protein, and low-fat dairy products. Limit your intake of foods with high amounts of sugar, saturated fats, and salt.  Take vitamin and mineral supplements as recommended by your health care provider.  Do not drink alcohol if your health care provider tells you not to drink.  If you drink alcohol: ? Limit how much you have to 0-2 drinks a day. ? Be aware of how much alcohol is in your drink. In the [place] one drink equals one 12 oz bottle of beer (355 mL), one 5 oz glass of wine (148 mL), or one 1 oz glass of hard liquor (44 mL). Lifestyle   Take daily care of your teeth and gums.  Stay active. Exercise for at least 30 minutes on 5 or more days each week.  Do not use any products that contain nicotine or tobacco, such as cigarettes, e-cigarettes, and chewing tobacco. If you need help quitting, ask your health care provider.  If you are sexually active, practice safe sex. Use a condom or other form of protection to prevent STIs (sexually transmitted infections).  Talk with your health care provider about taking a low-dose aspirin or statin. What's next?  Visit your health care provider once a year for a well check visit.  Ask your health care provider how often you should have your eyes and teeth checked.  Stay up to date on all vaccines. This information is not intended to replace advice given to you by your health care provider. Make sure you discuss any questions you have with your health care provider. Document  Released: 03/28/2015 Document Revised: 02/23/2018 Document Reviewed: 02/23/2018 Elsevier Patient Education  2020 [place], [place] [facility]

## 2019-03-24 DIAGNOSIS — Z23 Encounter for immunization: Secondary | ICD-10-CM | POA: Diagnosis not present

## 2019-04-17 ENCOUNTER — Encounter: Payer: Self-pay | Admitting: Internal Medicine

## 2019-04-17 NOTE — Telephone Encounter (Signed)
Yes he should get the vaccine  

## 2019-04-17 NOTE — Telephone Encounter (Signed)
Dr Ardyth Harps is out of the office this week.  There are no known drug allergies.  Please advise.

## 2019-04-26 DIAGNOSIS — M6740 Ganglion, unspecified site: Secondary | ICD-10-CM | POA: Diagnosis not present

## 2019-04-30 ENCOUNTER — Encounter: Payer: Self-pay | Admitting: Internal Medicine

## 2019-05-10 DIAGNOSIS — D2362 Other benign neoplasm of skin of left upper limb, including shoulder: Secondary | ICD-10-CM | POA: Diagnosis not present

## 2019-05-10 DIAGNOSIS — D2112 Benign neoplasm of connective and other soft tissue of left upper limb, including shoulder: Secondary | ICD-10-CM | POA: Diagnosis not present

## 2019-05-23 ENCOUNTER — Encounter: Payer: Self-pay | Admitting: Internal Medicine

## 2019-07-09 ENCOUNTER — Other Ambulatory Visit: Payer: Self-pay | Admitting: Internal Medicine

## 2019-12-07 ENCOUNTER — Other Ambulatory Visit: Payer: Self-pay | Admitting: Internal Medicine

## 2020-01-07 ENCOUNTER — Other Ambulatory Visit: Payer: Self-pay | Admitting: Internal Medicine

## 2020-02-05 ENCOUNTER — Other Ambulatory Visit: Payer: Self-pay | Admitting: Internal Medicine

## 2020-03-05 ENCOUNTER — Other Ambulatory Visit: Payer: Self-pay | Admitting: Internal Medicine

## 2020-03-22 DIAGNOSIS — U071 COVID-19: Secondary | ICD-10-CM | POA: Diagnosis not present

## 2020-03-27 ENCOUNTER — Other Ambulatory Visit: Payer: BC Managed Care – PPO

## 2020-04-07 ENCOUNTER — Other Ambulatory Visit: Payer: Self-pay | Admitting: Internal Medicine

## 2020-04-14 ENCOUNTER — Encounter: Payer: Self-pay | Admitting: Internal Medicine

## 2020-04-17 DIAGNOSIS — H2513 Age-related nuclear cataract, bilateral: Secondary | ICD-10-CM | POA: Diagnosis not present

## 2020-04-28 ENCOUNTER — Other Ambulatory Visit: Payer: Self-pay

## 2020-04-29 ENCOUNTER — Encounter: Payer: Self-pay | Admitting: Internal Medicine

## 2020-04-29 ENCOUNTER — Ambulatory Visit (INDEPENDENT_AMBULATORY_CARE_PROVIDER_SITE_OTHER): Payer: BC Managed Care – PPO | Admitting: Internal Medicine

## 2020-04-29 VITALS — BP 120/84 | HR 60 | Temp 98.0°F | Ht 72.5 in | Wt 206.1 lb

## 2020-04-29 DIAGNOSIS — E785 Hyperlipidemia, unspecified: Secondary | ICD-10-CM

## 2020-04-29 DIAGNOSIS — I10 Essential (primary) hypertension: Secondary | ICD-10-CM

## 2020-04-29 DIAGNOSIS — Z0000 Encounter for general adult medical examination without abnormal findings: Secondary | ICD-10-CM

## 2020-04-29 LAB — CBC WITH DIFFERENTIAL/PLATELET
Basophils Absolute: 0.1 10*3/uL (ref 0.0–0.1)
Basophils Relative: 0.9 % (ref 0.0–3.0)
Eosinophils Absolute: 0.2 10*3/uL (ref 0.0–0.7)
Eosinophils Relative: 2.3 % (ref 0.0–5.0)
HCT: 43 % (ref 39.0–52.0)
Hemoglobin: 14.8 g/dL (ref 13.0–17.0)
Lymphocytes Relative: 15.3 % (ref 12.0–46.0)
Lymphs Abs: 1.5 10*3/uL (ref 0.7–4.0)
MCHC: 34.5 g/dL (ref 30.0–36.0)
MCV: 87.5 fl (ref 78.0–100.0)
Monocytes Absolute: 0.7 10*3/uL (ref 0.1–1.0)
Monocytes Relative: 6.8 % (ref 3.0–12.0)
Neutro Abs: 7.5 10*3/uL (ref 1.4–7.7)
Neutrophils Relative %: 74.7 % (ref 43.0–77.0)
Platelets: 249 10*3/uL (ref 150.0–400.0)
RBC: 4.91 Mil/uL (ref 4.22–5.81)
RDW: 13.3 % (ref 11.5–15.5)
WBC: 10.1 10*3/uL (ref 4.0–10.5)

## 2020-04-29 LAB — COMPREHENSIVE METABOLIC PANEL WITH GFR
ALT: 27 U/L (ref 0–53)
AST: 21 U/L (ref 0–37)
Albumin: 4.5 g/dL (ref 3.5–5.2)
Alkaline Phosphatase: 55 U/L (ref 39–117)
BUN: 10 mg/dL (ref 6–23)
CO2: 32 meq/L (ref 19–32)
Calcium: 9.6 mg/dL (ref 8.4–10.5)
Chloride: 102 meq/L (ref 96–112)
Creatinine, Ser: 1.06 mg/dL (ref 0.40–1.50)
GFR: 72.32 mL/min (ref >60.00)
Glucose, Bld: 86 mg/dL (ref 70–99)
Potassium: 4.1 meq/L (ref 3.5–5.1)
Sodium: 141 meq/L (ref 135–145)
Total Bilirubin: 0.8 mg/dL (ref 0.2–1.2)
Total Protein: 6.7 g/dL (ref 6.0–8.3)

## 2020-04-29 LAB — HEMOGLOBIN A1C: Hgb A1c MFr Bld: 5.6 % (ref 4.6–6.5)

## 2020-04-29 LAB — LIPID PANEL
Cholesterol: 151 mg/dL (ref 0–200)
HDL: 45.3 mg/dL (ref >39.00)
LDL Cholesterol: 91 mg/dL (ref 0–99)
NonHDL: 105.33
Total CHOL/HDL Ratio: 3
Triglycerides: 74 mg/dL (ref 0.0–149.0)
VLDL: 14.8 mg/dL (ref 0.0–40.0)

## 2020-04-29 LAB — VITAMIN B12: Vitamin B-12: 197 pg/mL — ABNORMAL LOW (ref 211–911)

## 2020-04-29 LAB — TSH: TSH: 2.05 u[IU]/mL (ref 0.35–4.50)

## 2020-04-29 LAB — VITAMIN D 25 HYDROXY (VIT D DEFICIENCY, FRACTURES): VITD: 90.41 ng/mL (ref 30.00–100.00)

## 2020-04-29 NOTE — Patient Instructions (Signed)
-Nice seeing you today!!  -Lab work today; will notify you once results are available.  -Schedule follow up in 1 year or sooner as needed.   Preventive Care 68 Years and Older, Male Preventive care refers to lifestyle choices and visits with your health care provider that can promote health and wellness. This includes:  A yearly physical exam. This is also called an annual wellness visit.  Regular dental and eye exams.  Immunizations.  Screening for certain conditions.  Healthy lifestyle choices, such as: ? Eating a healthy diet. ? Getting regular exercise. ? Not using drugs or products that contain nicotine and tobacco. ? Limiting alcohol use. What can I expect for my preventive care visit? Physical exam Your health care provider will check your:  Height and weight. These may be used to calculate your BMI (body mass index). BMI is a measurement that tells if you are at a healthy weight.  Heart rate and blood pressure.  Body temperature.  Skin for abnormal spots. Counseling Your health care provider may ask you questions about your:  Past medical problems.  Family's medical history.  Alcohol, tobacco, and drug use.  Emotional well-being.  Home life and relationship well-being.  Sexual activity.  Diet, exercise, and sleep habits.  History of falls.  Memory and ability to understand (cognition).  Work and work environment.  Access to firearms. What immunizations do I need? Vaccines are usually given at various ages, according to a schedule. Your health care provider will recommend vaccines for you based on your age, medical history, and lifestyle or other factors, such as travel or where you work.   What tests do I need? Blood tests  Lipid and cholesterol levels. These may be checked every 5 years, or more often depending on your overall health.  Hepatitis C test.  Hepatitis B test. Screening  Lung cancer screening. You may have this screening every  year starting at age 55 if you have a 30-pack-year history of smoking and currently smoke or have quit within the past 15 years.  Colorectal cancer screening. ? All adults should have this screening starting at age 50 and continuing until age 75. ? Your health care provider may recommend screening at age 45 if you are at increased risk. ? You will have tests every 1-10 years, depending on your results and the type of screening test.  Prostate cancer screening. Recommendations will vary depending on your family history and other risks.  Genital exam to check for testicular cancer or hernias.  Diabetes screening. ? This is done by checking your blood sugar (glucose) after you have not eaten for a while (fasting). ? You may have this done every 1-3 years.  Abdominal aortic aneurysm (AAA) screening. You may need this if you are a current or former smoker.  STD (sexually transmitted disease) testing, if you are at risk. Follow these instructions at home: Eating and drinking  Eat a diet that includes fresh fruits and vegetables, whole grains, lean protein, and low-fat dairy products. Limit your intake of foods with high amounts of sugar, saturated fats, and salt.  Take vitamin and mineral supplements as recommended by your health care provider.  Do not drink alcohol if your health care provider tells you not to drink.  If you drink alcohol: ? Limit how much you have to 0-2 drinks a day. ? Be aware of how much alcohol is in your drink. In the [place] one drink equals one 12 oz bottle of beer (355 mL),   one 5 oz glass of wine (148 mL), or one 1 oz glass of hard liquor (44 mL).   Lifestyle  Take daily care of your teeth and gums. Brush your teeth every morning and night with fluoride toothpaste. Floss one time each day.  Stay active. Exercise for at least 30 minutes 5 or more days each week.  Do not use any products that contain nicotine or tobacco, such as cigarettes, e-cigarettes, and  chewing tobacco. If you need help quitting, ask your health care provider.  Do not use drugs.  If you are sexually active, practice safe sex. Use a condom or other form of protection to prevent STIs (sexually transmitted infections).  Talk with your health care provider about taking a low-dose aspirin or statin.  Find healthy ways to cope with stress, such as: ? Meditation, yoga, or listening to music. ? Journaling. ? Talking to a trusted person. ? Spending time with friends and family. Safety  Always wear your seat belt while driving or riding in a vehicle.  Do not drive: ? If you have been drinking alcohol. Do not ride with someone who has been drinking. ? When you are tired or distracted. ? While texting.  Wear a helmet and other protective equipment during sports activities.  If you have firearms in your house, make sure you follow all gun safety procedures. What's next?  Visit your health care provider once a year for an annual wellness visit.  Ask your health care provider how often you should have your eyes and teeth checked.  Stay up to date on all vaccines. This information is not intended to replace advice given to you by your health care provider. Make sure you discuss any questions you have with your health care provider. Document Revised: 11/28/2018 Document Reviewed: 02/23/2018 Elsevier Patient Education  2021 [employer]  

## 2020-04-29 NOTE — Progress Notes (Signed)
Established Patient Office Visit     This visit occurred during the SARS-CoV-2 public health emergency.  Safety protocols were in place, including screening questions prior to the visit, additional usage of staff PPE, and extensive cleaning of exam room while observing appropriate contact time as indicated for disinfecting solutions.    CC/Reason for Visit: Annual preventive exam  HPI: Jason Gould is a 70 y.o. male who is coming in today for the above mentioned reasons. Past Medical History is significant for: Well-controlled hypertension and hyperlipidemia on a statin.  He has erectile dysfunction and uses sildenafil as needed.  He has routine eye and dental care.  All immunizations are up-to-date with the exception of Prevnar which he declines today.  His last colonoscopy was in 2017, he is scheduled for redo colonoscopy in April.  He has been having some foot issues and he wonders if they might be statin induced.  He tells me that his father had the same issues and they improved once he stopped taking his statin.   Past Medical/Surgical History: Past Medical History:  Diagnosis Date  . Allergy    seasonal  . Hx of colonic polyps   . Hyperlipidemia   . Hypertension     Past Surgical History:  Procedure Laterality Date  . COLONOSCOPY    . TONSILLECTOMY    . VASECTOMY      Social History:  reports that he has never smoked. He has never used smokeless tobacco. He reports current alcohol use of about 2.0 standard drinks of alcohol per week. He reports that he does not use drugs.  Allergies: No Known Allergies  Family History:  Family History  Problem Relation Age of Onset  . Hyperlipidemia Mother   . Colon cancer Mother   . Hypertension Father   . Hyperlipidemia Father   . Colon cancer Sister   . Colon polyps Neg Hx   . Esophageal cancer Neg Hx   . Rectal cancer Neg Hx   . Stomach cancer Neg Hx      Current Outpatient Medications:  .  atorvastatin  (LIPITOR) 20 MG tablet, TAKE 1 TABLET ONCE DAILY., Disp: 90 tablet, Rfl: 0 .  chlorthalidone (HYGROTON) 25 MG tablet, TAKE 1 TABLET ONCE DAILY., Disp: 90 tablet, Rfl: 0 .  metoprolol succinate (TOPROL-XL) 25 MG 24 hr tablet, TAKE 1 TABLET ONCE DAILY., Disp: 90 tablet, Rfl: 0 .  sildenafil (VIAGRA) 25 MG tablet, Take 1 tablet (25 mg total) by mouth daily as needed for erectile dysfunction., Disp: 15 tablet, Rfl: 3  Review of Systems:  Constitutional: Denies fever, chills, diaphoresis, appetite change and fatigue.  HEENT: Denies photophobia, eye pain, redness, hearing loss, ear pain, congestion, sore throat, rhinorrhea, sneezing, mouth sores, trouble swallowing, neck pain, neck stiffness and tinnitus.   Respiratory: Denies SOB, DOE, cough, chest tightness,  and wheezing.   Cardiovascular: Denies chest pain, palpitations and leg swelling.  Gastrointestinal: Denies nausea, vomiting, abdominal pain, diarrhea, constipation, blood in stool and abdominal distention.  Genitourinary: Denies dysuria, urgency, frequency, hematuria, flank pain and difficulty urinating.  Endocrine: Denies: hot or cold intolerance, sweats, changes in hair or nails, polyuria, polydipsia. Musculoskeletal: Denies myalgias, back pain, joint swelling, arthralgias and gait problem.  Skin: Denies pallor, rash and wound.  Neurological: Denies dizziness, seizures, syncope, weakness, light-headedness, numbness and headaches.  Hematological: Denies adenopathy. Easy bruising, personal or family bleeding history  Psychiatric/Behavioral: Denies suicidal ideation, mood changes, confusion, nervousness, sleep disturbance and agitation    Physical  Exam: Vitals:   04/29/20 1305  BP: 120/84  Pulse: 60  Temp: 98 F (36.7 C)  TempSrc: Oral  SpO2: 98%  Weight: 206 lb 1.6 oz (93.5 kg)  Height: 6' 0.5" (1.842 m)    Body mass index is 27.57 kg/m.   Constitutional: NAD, calm, comfortable Eyes: PERRL, lids and conjunctivae normal ENMT:  Mucous membranes are moist. Posterior pharynx clear of any exudate or lesions. Normal dentition. Tympanic membrane is pearly white, no erythema or bulging. Neck: normal, supple, no masses, no thyromegaly Respiratory: clear to auscultation bilaterally, no wheezing, no crackles. Normal respiratory effort. No accessory muscle use.  Cardiovascular: Regular rate and rhythm, no murmurs / rubs / gallops. No extremity edema. 2+ pedal pulses. No carotid bruits.  Abdomen: no tenderness, no masses palpated. No hepatosplenomegaly. Bowel sounds positive.  Musculoskeletal: no clubbing / cyanosis. No joint deformity upper and lower extremities. Good ROM, no contractures. Normal muscle tone.  Skin: no rashes, lesions, ulcers. No induration Neurologic: CN 2-12 grossly intact. Sensation intact, DTR normal. Strength 5/5 in all 4.  Psychiatric: Normal judgment and insight. Alert and oriented x 3. Normal mood.    Impression and Plan:  Encounter for preventive health examination  -He has routine eye and dental care. -All immunizations are up-to-date except for pneumonia which he declines. -Screening labs today. -Healthy lifestyle discussed in detail. -PSA today for prostate cancer screening. -He had a colonoscopy in 2017, is a 10-year callback.  Colonoscopy has been scheduled for April.  Hyperlipidemia, unspecified hyperlipidemia type  - Plan: Lipid panel -He will start taking his atorvastatin 3 days a week to see if this alleviates his foot issues.  It might be beneficial for him to have a redo lipid panel in 6 months.  Essential hypertension, benign  -Blood pressures well controlled on chlorthalidone and metoprolol.   Patient Instructions   -Nice seeing you today!!  -Lab work today; will notify you once results are available.  -Schedule follow up in 1 year or sooner as needed.   Preventive Care 21 Years and Older, Male Preventive care refers to lifestyle choices and visits with your health care  provider that can promote health and wellness. This includes:  A yearly physical exam. This is also called an annual wellness visit.  Regular dental and eye exams.  Immunizations.  Screening for certain conditions.  Healthy lifestyle choices, such as: ? Eating a healthy diet. ? Getting regular exercise. ? Not using drugs or products that contain nicotine and tobacco. ? Limiting alcohol use. What can I expect for my preventive care visit? Physical exam Your health care provider will check your:  Height and weight. These may be used to calculate your BMI (body mass index). BMI is a measurement that tells if you are at a healthy weight.  Heart rate and blood pressure.  Body temperature.  Skin for abnormal spots. Counseling Your health care provider may ask you questions about your:  Past medical problems.  Family's medical history.  Alcohol, tobacco, and drug use.  Emotional well-being.  Home life and relationship well-being.  Sexual activity.  Diet, exercise, and sleep habits.  History of falls.  Memory and ability to understand (cognition).  Work and work Astronomer.  Access to firearms. What immunizations do I need? Vaccines are usually given at various ages, according to a schedule. Your health care provider will recommend vaccines for you based on your age, medical history, and lifestyle or other factors, such as travel or where you work.  What tests do I need? Blood tests  Lipid and cholesterol levels. These may be checked every 5 years, or more often depending on your overall health.  Hepatitis C test.  Hepatitis B test. Screening  Lung cancer screening. You may have this screening every year starting at age 35 if you have a 30-pack-year history of smoking and currently smoke or have quit within the past 15 years.  Colorectal cancer screening. ? All adults should have this screening starting at age 33 and continuing until age 3. ? Your health  care provider may recommend screening at age 65 if you are at increased risk. ? You will have tests every 1-10 years, depending on your results and the type of screening test.  Prostate cancer screening. Recommendations will vary depending on your family history and other risks.  Genital exam to check for testicular cancer or hernias.  Diabetes screening. ? This is done by checking your blood sugar (glucose) after you have not eaten for a while (fasting). ? You may have this done every 1-3 years.  Abdominal aortic aneurysm (AAA) screening. You may need this if you are a current or former smoker.  STD (sexually transmitted disease) testing, if you are at risk. Follow these instructions at home: Eating and drinking  Eat a diet that includes fresh fruits and vegetables, whole grains, lean protein, and low-fat dairy products. Limit your intake of foods with high amounts of sugar, saturated fats, and salt.  Take vitamin and mineral supplements as recommended by your health care provider.  Do not drink alcohol if your health care provider tells you not to drink.  If you drink alcohol: ? Limit how much you have to 0-2 drinks a day. ? Be aware of how much alcohol is in your drink. In the [place] one drink equals one 12 oz bottle of beer (355 mL), one 5 oz glass of wine (148 mL), or one 1 oz glass of hard liquor (44 mL).   Lifestyle  Take daily care of your teeth and gums. Brush your teeth every morning and night with fluoride toothpaste. Floss one time each day.  Stay active. Exercise for at least 30 minutes 5 or more days each week.  Do not use any products that contain nicotine or tobacco, such as cigarettes, e-cigarettes, and chewing tobacco. If you need help quitting, ask your health care provider.  Do not use drugs.  If you are sexually active, practice safe sex. Use a condom or other form of protection to prevent STIs (sexually transmitted infections).  Talk with your health care  provider about taking a low-dose aspirin or statin.  Find healthy ways to cope with stress, such as: ? Meditation, yoga, or listening to music. ? Journaling. ? Talking to a trusted person. ? Spending time with friends and family. Safety  Always wear your seat belt while driving or riding in a vehicle.  Do not drive: ? If you have been drinking alcohol. Do not ride with someone who has been drinking. ? When you are tired or distracted. ? While texting.  Wear a helmet and other protective equipment during sports activities.  If you have firearms in your house, make sure you follow all gun safety procedures. What's next?  Visit your health care provider once a year for an annual wellness visit.  Ask your health care provider how often you should have your eyes and teeth checked.  Stay up to date on all vaccines. This information is not intended to  replace advice given to you by your health care provider. Make sure you discuss any questions you have with your health care provider. Document Revised: 11/28/2018 Document Reviewed: 02/23/2018 Elsevier Patient Education  2021 [place], [place] [facility]

## 2020-04-30 ENCOUNTER — Encounter: Payer: Self-pay | Admitting: Internal Medicine

## 2020-04-30 ENCOUNTER — Other Ambulatory Visit: Payer: Self-pay | Admitting: *Deleted

## 2020-04-30 DIAGNOSIS — N529 Male erectile dysfunction, unspecified: Secondary | ICD-10-CM

## 2020-04-30 DIAGNOSIS — E538 Deficiency of other specified B group vitamins: Secondary | ICD-10-CM | POA: Insufficient documentation

## 2020-04-30 MED ORDER — METOPROLOL SUCCINATE ER 25 MG PO TB24: 25.0000 mg | ORAL_TABLET | Freq: Every day | ORAL | 1 refills | Status: DC

## 2020-04-30 MED ORDER — ATORVASTATIN CALCIUM 20 MG PO TABS: 20.0000 mg | ORAL_TABLET | Freq: Every day | ORAL | 1 refills | Status: DC

## 2020-04-30 MED ORDER — CHLORTHALIDONE 25 MG PO TABS: 25.0000 mg | ORAL_TABLET | Freq: Every day | ORAL | 1 refills | Status: DC

## 2020-04-30 NOTE — Telephone Encounter (Signed)
Patient is requesting a refill of Viagra (generic okay).  Patient states that he had discussed increasing his dosage.  [facility].  Okay to send with new dosage?

## 2020-05-01 MED ORDER — SILDENAFIL CITRATE 25 MG PO TABS: 25.0000 mg | ORAL_TABLET | Freq: Every day | ORAL | 3 refills | Status: DC | PRN

## 2020-05-06 ENCOUNTER — Ambulatory Visit: Payer: Self-pay

## 2020-05-20 NOTE — Telephone Encounter (Signed)
Telephone encounter initiated in error

## 2020-05-30 ENCOUNTER — Other Ambulatory Visit: Payer: Self-pay

## 2020-05-30 ENCOUNTER — Ambulatory Visit (AMBULATORY_SURGERY_CENTER): Payer: BC Managed Care – PPO | Admitting: *Deleted

## 2020-05-30 VITALS — Ht 72.5 in | Wt 195.0 lb

## 2020-05-30 DIAGNOSIS — Z800 Family history of malignant neoplasm of digestive organs: Secondary | ICD-10-CM

## 2020-05-30 MED ORDER — PLENVU 140 G PO SOLR: 1.0000 | Freq: Once | ORAL | 0 refills | Status: AC

## 2020-05-30 NOTE — Progress Notes (Signed)
Pt's previsit is done over the phone and all paperwork (prep instructions, blank consent form to just read over, pre-procedure acknowledgement form and stamped envelope) sent to patient  covid vaccines x3   No trouble with anesthesia, denies trouble moving neck, or hx/fam hx of malignant hyperthermia per pt    No egg or soy allergy  No home oxygen use   No medications for weight loss taken  Pt denies constipation issues  Pt informed that we do not do prior authorizations for prep  Plenvu coupon given and code put into RX

## 2020-06-13 ENCOUNTER — Encounter: Payer: BC Managed Care – PPO | Admitting: Internal Medicine

## 2020-06-23 ENCOUNTER — Other Ambulatory Visit (HOSPITAL_COMMUNITY)
Admission: RE | Admit: 2020-06-23 | Discharge: 2020-06-23 | Disposition: A | Discharge status: Home / Self Care | Payer: BC Managed Care – PPO | Source: Ambulatory Visit | Attending: Internal Medicine | Admitting: Internal Medicine

## 2020-06-23 ENCOUNTER — Other Ambulatory Visit: Payer: BC Managed Care – PPO

## 2020-06-23 DIAGNOSIS — Z20822 Contact with and (suspected) exposure to covid-19: Secondary | ICD-10-CM | POA: Insufficient documentation

## 2020-06-23 DIAGNOSIS — Z01812 Encounter for preprocedural laboratory examination: Secondary | ICD-10-CM | POA: Diagnosis not present

## 2020-06-23 LAB — SARS CORONAVIRUS 2 (TAT 6-24 HRS): SARS Coronavirus 2: NEGATIVE

## 2020-06-25 ENCOUNTER — Other Ambulatory Visit: Payer: Self-pay

## 2020-06-25 ENCOUNTER — Encounter: Payer: Self-pay | Admitting: Internal Medicine

## 2020-06-25 ENCOUNTER — Ambulatory Visit (AMBULATORY_SURGERY_CENTER): Payer: BC Managed Care – PPO | Admitting: Internal Medicine

## 2020-06-25 VITALS — BP 123/65 | HR 60 | Temp 97.3°F | Resp 13 | Ht 72.5 in | Wt 195.0 lb

## 2020-06-25 DIAGNOSIS — Z800 Family history of malignant neoplasm of digestive organs: Secondary | ICD-10-CM | POA: Diagnosis not present

## 2020-06-25 DIAGNOSIS — Z1211 Encounter for screening for malignant neoplasm of colon: Secondary | ICD-10-CM | POA: Diagnosis not present

## 2020-06-25 MED ORDER — SODIUM CHLORIDE 0.9 % IV SOLN: 500.0000 mL | Freq: Once | INTRAVENOUS | Status: DC

## 2020-06-25 NOTE — Progress Notes (Signed)
Pt's states no medical or surgical changes since previsit or office visit. 

## 2020-06-25 NOTE — Progress Notes (Signed)
A and O x3. Report to RN. Tolerated MAC anesthesia well.

## 2020-06-25 NOTE — Patient Instructions (Signed)
YOU HAD AN ENDOSCOPIC PROCEDURE TODAY AT THE [facility]:   Refer to the procedure report that was given to you for any specific questions about what was found during the examination.  If the procedure report does not answer your questions, please call your gastroenterologist to clarify.  If you requested that your care partner not be given the details of your procedure findings, then the procedure report has been included in a sealed envelope for you to review at your convenience later.  YOU SHOULD EXPECT: Some feelings of bloating in the abdomen. Passage of more gas than usual.  Walking can help get rid of the air that was put into your GI tract during the procedure and reduce the bloating. If you had a lower endoscopy (such as a colonoscopy or flexible sigmoidoscopy) you may notice spotting of blood in your stool or on the toilet paper. If you underwent a bowel prep for your procedure, you may not have a normal bowel movement for a few days.  Please Note:  You might notice some irritation and congestion in your nose or some drainage.  This is from the oxygen used during your procedure.  There is no need for concern and it should clear up in a day or so.  SYMPTOMS TO REPORT IMMEDIATELY:   Following lower endoscopy (colonoscopy or flexible sigmoidoscopy):  Excessive amounts of blood in the stool  Significant tenderness or worsening of abdominal pains  Swelling of the abdomen that is new, acute  Fever of 100F or higher   Following upper endoscopy (EGD)  Vomiting of blood or coffee ground material  New chest pain or pain under the shoulder blades  Painful or persistently difficult swallowing  New shortness of breath  Fever of 100F or higher  Black, tarry-looking stools  For urgent or emergent issues, a gastroenterologist can be reached at any hour by calling [phone]. Do not use MyChart messaging for urgent concerns.    DIET:  We do recommend a small meal at first, but  then you may proceed to your regular diet.  Drink plenty of fluids but you should avoid alcoholic beverages for 24 hours.  ACTIVITY:  You should plan to take it easy for the rest of today and you should NOT DRIVE or use heavy machinery until tomorrow (because of the sedation medicines used during the test).    FOLLOW UP: Our staff will call the number listed on your records 48-72 hours following your procedure to check on you and address any questions or concerns that you may have regarding the information given to you following your procedure. If we do not reach you, we will leave a message.  We will attempt to reach you two times.  During this call, we will ask if you have developed any symptoms of COVID 19. If you develop any symptoms (ie: fever, flu-like symptoms, shortness of breath, cough etc.) before then, please call [phone].  If you test positive for Covid 19 in the 2 weeks post procedure, please call and report this information to us.    If any biopsies were taken you will be contacted by phone or by letter within the next 1-3 weeks.  Please call us at [phone] if you have not heard about the biopsies in 3 weeks.    SIGNATURES/CONFIDENTIALITY: You and/or your care partner have signed paperwork which will be entered into your electronic medical record.  These signatures attest to the fact that that the information above on   your After Visit Summary has been reviewed and is understood.  Full responsibility of the confidentiality of this discharge information lies with you and/or your care-partner. 

## 2020-06-25 NOTE — Op Note (Signed)
[facility] Patient Name: Jason Gould Procedure Date: 06/25/2020 3:39 PM MRN: [id number] Endoscopist: Wilhemina Bonito. Marina Goodell , MD Age: 70 Referring MD:  Date of Birth: [birth year] Gender: Male Account #: [id number] Procedure:                Colonoscopy Indications:              Colon cancer screening in patient at increased                            risk: Colorectal cancer in mother, Colon cancer                            screening in patient at increased risk: Colorectal                            cancer in sister. Previous examinations 1999, 2004,                            2010 (Dr. Juanda Chance); 2017 Medicines:                Monitored Anesthesia Care Procedure:                Pre-Anesthesia Assessment:                           - Prior to the procedure, a History and Physical                            was performed, and patient medications and                            allergies were reviewed. The patient's tolerance of                            previous anesthesia was also reviewed. The risks                            and benefits of the procedure and the sedation                            options and risks were discussed with the patient.                            All questions were answered, and informed consent                            was obtained. Prior Anticoagulants: The patient has                            taken no previous anticoagulant or antiplatelet                            agents. ASA Grade Assessment: II - A patient with  mild systemic disease. After reviewing the risks                            and benefits, the patient was deemed in                            satisfactory condition to undergo the procedure.                           After obtaining informed consent, the colonoscope                            was passed under direct vision. Throughout the                            procedure, the patient's blood pressure, pulse,  and                            oxygen saturations were monitored continuously. The                            Olympus [id number]-[id number]) Colonoscope was                            introduced through the anus and advanced to the the                            cecum, identified by appendiceal orifice and                            ileocecal valve. The ileocecal valve, appendiceal                            orifice, and rectum were photographed. The quality                            of the bowel preparation was excellent. The                            colonoscopy was performed without difficulty. The                            patient tolerated the procedure well. The bowel                            preparation used was SUPREP via split dose                            instruction. Scope In: 3:55:06 PM Scope Out: 4:08:43 PM Scope Withdrawal Time: 0 hours 11 minutes 1 second  Total Procedure Duration: 0 hours 13 minutes 37 seconds  Findings:                 Multiple diverticula were found in the left colon  and right colon.                           The exam was otherwise without abnormality on                            direct and retroflexion views. Complications:            No immediate complications. Estimated blood loss:                            None. Estimated Blood Loss:     Estimated blood loss: none. Impression:               - Diverticulosis in the left colon and in the right                            colon.                           - The examination was otherwise normal on direct                            and retroflexion views.                           - No specimens collected. Recommendation:           - Repeat colonoscopy in 5 years for screening                            purposes (family history).                           - Patient has a contact number available for                            emergencies. The signs and symptoms of  potential                            delayed complications were discussed with the                            patient. Return to normal activities tomorrow.                            Written discharge instructions were provided to the                            patient.                           - Resume previous diet.                           - Continue present medications. Wilhemina Bonito. Marina Goodell, MD 06/25/2020 4:14:57 PM This report has been signed electronically.

## 2020-06-30 ENCOUNTER — Telehealth: Payer: Self-pay

## 2020-06-30 NOTE — Telephone Encounter (Signed)
LVM

## 2020-07-04 ENCOUNTER — Encounter: Payer: BC Managed Care – PPO | Admitting: Internal Medicine

## 2020-08-31 ENCOUNTER — Other Ambulatory Visit: Payer: Self-pay | Admitting: Internal Medicine

## 2020-12-20 ENCOUNTER — Other Ambulatory Visit: Payer: Self-pay | Admitting: Internal Medicine

## 2021-06-09 ENCOUNTER — Other Ambulatory Visit: Payer: Self-pay | Admitting: Internal Medicine

## 2021-06-09 DIAGNOSIS — N529 Male erectile dysfunction, unspecified: Secondary | ICD-10-CM

## 2021-07-08 ENCOUNTER — Ambulatory Visit (INDEPENDENT_AMBULATORY_CARE_PROVIDER_SITE_OTHER): Payer: BC Managed Care – PPO | Admitting: Internal Medicine

## 2021-07-08 ENCOUNTER — Other Ambulatory Visit: Payer: Self-pay | Admitting: Internal Medicine

## 2021-07-08 ENCOUNTER — Encounter: Payer: Self-pay | Admitting: Internal Medicine

## 2021-07-08 ENCOUNTER — Telehealth: Payer: Self-pay | Admitting: Internal Medicine

## 2021-07-08 VITALS — BP 140/90 | HR 53 | Temp 98.2°F | Ht 72.5 in | Wt 207.9 lb

## 2021-07-08 DIAGNOSIS — E538 Deficiency of other specified B group vitamins: Secondary | ICD-10-CM

## 2021-07-08 DIAGNOSIS — I10 Essential (primary) hypertension: Secondary | ICD-10-CM | POA: Diagnosis not present

## 2021-07-08 DIAGNOSIS — E785 Hyperlipidemia, unspecified: Secondary | ICD-10-CM

## 2021-07-08 DIAGNOSIS — R972 Elevated prostate specific antigen [PSA]: Secondary | ICD-10-CM

## 2021-07-08 DIAGNOSIS — Z0000 Encounter for general adult medical examination without abnormal findings: Secondary | ICD-10-CM | POA: Diagnosis not present

## 2021-07-08 DIAGNOSIS — N529 Male erectile dysfunction, unspecified: Secondary | ICD-10-CM

## 2021-07-08 LAB — LIPID PANEL
Cholesterol: 143 mg/dL (ref 0–200)
HDL: 43.3 mg/dL (ref >39.00)
LDL Cholesterol: 83 mg/dL (ref 0–99)
NonHDL: 99.49
Total CHOL/HDL Ratio: 3
Triglycerides: 84 mg/dL (ref 0.0–149.0)
VLDL: 16.8 mg/dL (ref 0.0–40.0)

## 2021-07-08 LAB — CBC WITH DIFFERENTIAL/PLATELET
Basophils Absolute: 0.1 10*3/uL (ref 0.0–0.1)
Basophils Relative: 1.8 % (ref 0.0–3.0)
Eosinophils Absolute: 0.2 10*3/uL (ref 0.0–0.7)
Eosinophils Relative: 3.1 % (ref 0.0–5.0)
HCT: 44 % (ref 39.0–52.0)
Hemoglobin: 14.6 g/dL (ref 13.0–17.0)
Lymphocytes Relative: 17.9 % (ref 12.0–46.0)
Lymphs Abs: 1.2 10*3/uL (ref 0.7–4.0)
MCHC: 33.1 g/dL (ref 30.0–36.0)
MCV: 91.3 fl (ref 78.0–100.0)
Monocytes Absolute: 0.6 10*3/uL (ref 0.1–1.0)
Monocytes Relative: 9.1 % (ref 3.0–12.0)
Neutro Abs: 4.6 10*3/uL (ref 1.4–7.7)
Neutrophils Relative %: 68.1 % (ref 43.0–77.0)
Platelets: 219 10*3/uL (ref 150.0–400.0)
RBC: 4.82 Mil/uL (ref 4.22–5.81)
RDW: 14 % (ref 11.5–15.5)
WBC: 6.7 10*3/uL (ref 4.0–10.5)

## 2021-07-08 LAB — COMPREHENSIVE METABOLIC PANEL
ALT: 25 U/L (ref 0–53)
AST: 26 U/L (ref 0–37)
Albumin: 4.7 g/dL (ref 3.5–5.2)
Alkaline Phosphatase: 59 U/L (ref 39–117)
BUN: 10 mg/dL (ref 6–23)
CO2: 30 mEq/L (ref 19–32)
Calcium: 9.2 mg/dL (ref 8.4–10.5)
Chloride: 104 mEq/L (ref 96–112)
Creatinine, Ser: 1.11 mg/dL (ref 0.40–1.50)
GFR: 67.86 mL/min (ref >60.00)
Glucose, Bld: 91 mg/dL (ref 70–99)
Potassium: 4.2 mEq/L (ref 3.5–5.1)
Sodium: 142 mEq/L (ref 135–145)
Total Bilirubin: 1 mg/dL (ref 0.2–1.2)
Total Protein: 6.5 g/dL (ref 6.0–8.3)

## 2021-07-08 LAB — PSA: PSA: 4.11 ng/mL — ABNORMAL HIGH (ref 0.10–4.00)

## 2021-07-08 LAB — VITAMIN B12: Vitamin B-12: 850 pg/mL (ref 211–911)

## 2021-07-08 LAB — HEMOGLOBIN A1C: Hgb A1c MFr Bld: 5.3 % (ref 4.6–6.5)

## 2021-07-08 MED ORDER — SILDENAFIL CITRATE 25 MG PO TABS: 25.0000 mg | ORAL_TABLET | Freq: Every day | ORAL | 1 refills | Status: DC

## 2021-07-08 MED ORDER — CHLORTHALIDONE 25 MG PO TABS: 25.0000 mg | ORAL_TABLET | Freq: Every day | ORAL | 1 refills | Status: DC

## 2021-07-08 NOTE — Telephone Encounter (Signed)
Pt calling regarding message about abnormal labs. He requests a callback at [phone] ?

## 2021-07-08 NOTE — Patient Instructions (Signed)
-  Nice seeing you today!! ? ?-Lab work today; will notify you once results are available. ? ?-Remember your COVID booster. ? ?-Resume chlorthalidone. ? ?-Schedule follow up in 6-12 months. ? ? ?

## 2021-07-08 NOTE — Progress Notes (Signed)
? ? ? ?Established Patient Office Visit ? ? ? ? ?This visit occurred during the SARS-CoV-2 public health emergency.  Safety protocols were in place, including screening questions prior to the visit, additional usage of staff PPE, and extensive cleaning of exam room while observing appropriate contact time as indicated for disinfecting solutions.  ? ? ?CC/Reason for Visit: Annual preventive exam ? ?HPI: Jason Gould is a 71 y.o. male who is coming in today for the above mentioned reasons. Past Medical History is significant for: Hypertension, hyperlipidemia, erectile dysfunction.  He is doing well and has no acute concerns or complaints today.  He takes sildenafil on a daily basis and is requesting a refill today.  He has not been taking his chlorthalidone for about 3 months because it was not included in his last refills.  Unsurprisingly his blood pressure is elevated today.  He has routine dental and eye care.  He is due for COVID booster but other immunizations are up-to-date.  He had a colonoscopy in April 2022. ? ? ?Past Medical/Surgical History: ?Past Medical History:  ?Diagnosis Date  ? Allergy   ? seasonal  ? Hx of colonic polyps   ? Hyperlipidemia   ? Hypertension   ? ? ?Past Surgical History:  ?Procedure Laterality Date  ? COLONOSCOPY    ? TONSILLECTOMY    ? VASECTOMY    ? ? ?Social History: ? reports that he has never smoked. He has never used smokeless tobacco. He reports current alcohol use of about 2.0 standard drinks per week. He reports that he does not use drugs. ? ?Allergies: ?No Known Allergies ? ?Family History:  ?Family History  ?Problem Relation Age of Onset  ? Hyperlipidemia Mother   ? Colon cancer Mother   ? Hypertension Father   ? Hyperlipidemia Father   ? Colon cancer Sister   ? Colon polyps Neg Hx   ? Esophageal cancer Neg Hx   ? Rectal cancer Neg Hx   ? Stomach cancer Neg Hx   ? ? ? ?Current Outpatient Medications:  ?  atorvastatin (LIPITOR) 20 MG tablet, Take 1 tablet (20 mg total) by  mouth daily., Disp: 90 tablet, Rfl: 0 ?  metoprolol succinate (TOPROL-XL) 25 MG 24 hr tablet, Take 1 tablet (25 mg total) by mouth daily., Disp: 90 tablet, Rfl: 0 ?  vitamin B-12 (CYANOCOBALAMIN) 500 MCG tablet, Take 500 mcg by mouth daily., Disp: , Rfl:  ?  VITAMIN D PO, Take by mouth daily., Disp: , Rfl:  ?  chlorthalidone (HYGROTON) 25 MG tablet, Take 1 tablet (25 mg total) by mouth daily., Disp: 90 tablet, Rfl: 1 ?  sildenafil (VIAGRA) 25 MG tablet, Take 1 tablet (25 mg total) by mouth daily., Disp: 90 tablet, Rfl: 1 ? ?Review of Systems:  ?Constitutional: Denies fever, chills, diaphoresis, appetite change and fatigue.  ?HEENT: Denies photophobia, eye pain, redness, hearing loss, ear pain, congestion, sore throat, rhinorrhea, sneezing, mouth sores, trouble swallowing, neck pain, neck stiffness and tinnitus.   ?Respiratory: Denies SOB, DOE, cough, chest tightness,  and wheezing.   ?Cardiovascular: Denies chest pain, palpitations and leg swelling.  ?Gastrointestinal: Denies nausea, vomiting, abdominal pain, diarrhea, constipation, blood in stool and abdominal distention.  ?Genitourinary: Denies dysuria, urgency, frequency, hematuria, flank pain and difficulty urinating.  ?Endocrine: Denies: hot or cold intolerance, sweats, changes in hair or nails, polyuria, polydipsia. ?Musculoskeletal: Denies myalgias, back pain, joint swelling, arthralgias and gait problem.  ?Skin: Denies pallor, rash and wound.  ?Neurological: Denies dizziness, seizures, syncope,  weakness, light-headedness, numbness and headaches.  ?Hematological: Denies adenopathy. Easy bruising, personal or family bleeding history  ?Psychiatric/Behavioral: Denies suicidal ideation, mood changes, confusion, nervousness, sleep disturbance and agitation ? ? ? ?Physical Exam: ?Vitals:  ? 07/08/21 0916 07/08/21 0926  ?BP: (!) 146/100 140/90  ?Pulse: (!) 53   ?Temp: 98.2 ?F (36.8 ?C)   ?TempSrc: Oral   ?SpO2: 97%   ?Weight: 207 lb 14.4 oz (94.3 kg)   ?Height: 6'  0.5" (1.842 m)   ? ? ?Body mass index is 27.81 kg/m?. ? ? ?Constitutional: NAD, calm, comfortable ?Eyes: PERRL, lids and conjunctivae normal ?ENMT: Mucous membranes are moist. Posterior pharynx clear of any exudate or lesions. Normal dentition. Tympanic membrane is pearly white, no erythema or bulging. ?Neck: normal, supple, no masses, no thyromegaly ?Respiratory: clear to auscultation bilaterally, no wheezing, no crackles. Normal respiratory effort. No accessory muscle use.  ?Cardiovascular: Regular rate and rhythm, no murmurs / rubs / gallops. No extremity edema. 2+ pedal pulses. No carotid bruits.  ?Abdomen: no tenderness, no masses palpated. No hepatosplenomegaly. Bowel sounds positive.  ?Musculoskeletal: no clubbing / cyanosis. No joint deformity upper and lower extremities. Good ROM, no contractures. Normal muscle tone.  ?Skin: no rashes, lesions, ulcers. No induration ?Neurologic: CN 2-12 grossly intact. Sensation intact, DTR normal. Strength 5/5 in all 4.  ?Psychiatric: Normal judgment and insight. Alert and oriented x 3. Normal mood.  ? ?Personnel officer Visit from 07/08/2021 in [facility]  ?PHQ-9 Total Score 0  ? ?  ? ? ?  01/24/2019  ? 10:06 AM 07/08/2021  ?  9:27 AM  ?Fall Risk  ?Falls in the past year? 0 0  ?Was there an injury with Fall? 0 0  ?Fall Risk Category Calculator 0 0  ?Fall Risk Category Low Low  ?Patient Fall Risk Level  Low fall risk  ?Patient at Risk for Falls Due to  No Fall Risks  ?Fall risk Follow up  Falls evaluation completed  ? ? ? ? ?Impression and Plan: ? ?Encounter for preventive health examination ?-Recommend routine eye and dental care. ?-Immunizations: Due for COVID booster he will get at pharmacy, he does not get pneumonia vaccines because he had a reaction to a previous one per report. ?-Healthy lifestyle discussed in detail. ?-Labs to be updated today. ?-Colon cancer screening: 06/2020 ?-Breast cancer screening: Not applicable ?-Cervical cancer  screening: Not applicable ?-Lung cancer screening: Never smoker, not applicable ?-Prostate cancer screening: PSA today ?-DEXA: Not applicable ? ?Vitamin B12 deficiency  ?- Plan: Vitamin B12 ? ?Essential hypertension, benign  ?- Plan: CBC with Differential/Platelet, Comprehensive metabolic panel, Hemoglobin A1c, chlorthalidone (HYGROTON) 25 MG tablet ?-Blood pressures not well controlled as he has not been taking chlorthalidone, he will resume at let me know if blood pressure is uncontrolled. ? ?Hyperlipidemia, unspecified hyperlipidemia type ? - Plan: Lipid panel ? ?Erectile dysfunction, unspecified erectile dysfunction type  ?- Plan: sildenafil (VIAGRA) 25 MG tablet he takes this daily. ? ? ? ? ?Patient Instructions  ?-Nice seeing you today!! ? ?-Lab work today; will notify you once results are available. ? ?-Remember your COVID booster. ? ?-Resume chlorthalidone. ? ?-Schedule follow up in 6-12 months. ? ? ? ? ? ?Lelon Frohlich, MD ?[facility] ? ? ?

## 2021-07-08 NOTE — Telephone Encounter (Signed)
Patient states that he had elevated PSA in the past.  He saw the urologist and was given an antibiotic which helped.  He would like to know if Dr Jerilee Hoh can prescribe an antibiotic before going to urology? ?  ?

## 2021-07-09 NOTE — Telephone Encounter (Signed)
Patient is aware. Patient now requests a referral to Dr Alinda Money.   ?

## 2021-07-11 ENCOUNTER — Encounter: Payer: Self-pay | Admitting: Internal Medicine

## 2021-08-28 ENCOUNTER — Other Ambulatory Visit: Payer: Self-pay | Admitting: Internal Medicine

## 2021-11-09 ENCOUNTER — Encounter: Payer: Self-pay | Admitting: Internal Medicine

## 2021-12-30 ENCOUNTER — Encounter: Payer: Self-pay | Admitting: Internal Medicine

## 2021-12-30 ENCOUNTER — Ambulatory Visit: Payer: BC Managed Care – PPO | Admitting: Internal Medicine

## 2021-12-30 VITALS — BP 120/84 | HR 58 | Temp 98.4°F | Wt 204.0 lb

## 2021-12-30 DIAGNOSIS — R972 Elevated prostate specific antigen [PSA]: Secondary | ICD-10-CM

## 2021-12-30 DIAGNOSIS — I10 Essential (primary) hypertension: Secondary | ICD-10-CM | POA: Diagnosis not present

## 2021-12-30 LAB — PSA: PSA: 6.86 ng/mL — ABNORMAL HIGH (ref 0.10–4.00)

## 2021-12-30 MED ORDER — LOSARTAN POTASSIUM 50 MG PO TABS: 50.0000 mg | ORAL_TABLET | Freq: Every day | ORAL | 1 refills | Status: DC

## 2021-12-30 NOTE — Progress Notes (Signed)
Established Patient Office Visit     CC/Reason for Visit: Discuss acute issues  HPI: Jason Gould is a 71 y.o. male who is coming in today for the above mentioned reasons.  During last set of labs over the spring he was found to have an elevated PSA.  Referral to urology was placed but he has not yet seen them.  He is wanting to recheck his PSA today.  In addition he did some Internet research that beta-blocker seem to be linked to vascular dementia.  His father had dementia and he is wanting to get off beta-blockers.   Past Medical/Surgical History: Past Medical History:  Diagnosis Date   Allergy    seasonal   Hx of colonic polyps    Hyperlipidemia    Hypertension     Past Surgical History:  Procedure Laterality Date   COLONOSCOPY     TONSILLECTOMY     VASECTOMY      Social History:  reports that he has never smoked. He has never used smokeless tobacco. He reports current alcohol use of about 2.0 standard drinks of alcohol per week. He reports that he does not use drugs.  Allergies: No Known Allergies  Family History:  Family History  Problem Relation Age of Onset   Hyperlipidemia Mother    Colon cancer Mother    Hypertension Father    Hyperlipidemia Father    Colon cancer Sister    Colon polyps Neg Hx    Esophageal cancer Neg Hx    Rectal cancer Neg Hx    Stomach cancer Neg Hx      Current Outpatient Medications:    atorvastatin (LIPITOR) 20 MG tablet, Take 1 tablet (20 mg total) by mouth daily., Disp: 90 tablet, Rfl: 1   chlorthalidone (HYGROTON) 25 MG tablet, Take 1 tablet (25 mg total) by mouth daily., Disp: 90 tablet, Rfl: 1   losartan (COZAAR) 50 MG tablet, Take 1 tablet (50 mg total) by mouth daily., Disp: 90 tablet, Rfl: 1   sildenafil (VIAGRA) 25 MG tablet, Take 1 tablet (25 mg total) by mouth daily., Disp: 90 tablet, Rfl: 1   vitamin B-12 (CYANOCOBALAMIN) 500 MCG tablet, Take 500 mcg by mouth daily., Disp: , Rfl:    VITAMIN D PO, Take by  mouth daily., Disp: , Rfl:   Review of Systems:  Constitutional: Denies fever, chills, diaphoresis, appetite change and fatigue.  HEENT: Denies photophobia, eye pain, redness, hearing loss, ear pain, congestion, sore throat, rhinorrhea, sneezing, mouth sores, trouble swallowing, neck pain, neck stiffness and tinnitus.   Respiratory: Denies SOB, DOE, cough, chest tightness,  and wheezing.   Cardiovascular: Denies chest pain, palpitations and leg swelling.  Gastrointestinal: Denies nausea, vomiting, abdominal pain, diarrhea, constipation, blood in stool and abdominal distention.  Genitourinary: Denies dysuria, urgency, frequency, hematuria, flank pain and difficulty urinating.  Endocrine: Denies: hot or cold intolerance, sweats, changes in hair or nails, polyuria, polydipsia. Musculoskeletal: Denies myalgias, back pain, joint swelling, arthralgias and gait problem.  Skin: Denies pallor, rash and wound.  Neurological: Denies dizziness, seizures, syncope, weakness, light-headedness, numbness and headaches.  Hematological: Denies adenopathy. Easy bruising, personal or family bleeding history  Psychiatric/Behavioral: Denies suicidal ideation, mood changes, confusion, nervousness, sleep disturbance and agitation    Physical Exam: Vitals:   12/30/21 0906  BP: 120/84  Pulse: (!) 58  Temp: 98.4 F (36.9 C)  TempSrc: Oral  SpO2: 98%  Weight: 204 lb (92.5 kg)    Body mass index is 27.29  kg/m.   Constitutional: NAD, calm, comfortable Eyes: PERRL, lids and conjunctivae normal ENMT: Mucous membranes are moist.  Respiratory: clear to auscultation bilaterally, no wheezing, no crackles. Normal respiratory effort. No accessory muscle use.  Cardiovascular: Regular rate and rhythm, no murmurs / rubs / gallops. No extremity edema.  Psychiatric: Normal judgment and insight. Alert and oriented x 3. Normal mood.    Impression and Plan:  Elevated PSA - Plan: PSA  Essential hypertension, benign -  Plan: losartan (COZAAR) 50 MG tablet  -Recheck PSA today, referral to urology has been placed he will call office to schedule. -Due to his concerns I will take him off metoprolol and place on losartan 50 mg.  He will do ambulatory blood pressure measuring and return in 4 to 6 weeks if remains elevated.  Time spent:31 minutes reviewing chart, interviewing and examining patient and formulating plan of care.      Lelon Frohlich, MD [facility] at [facility]

## 2022-02-21 ENCOUNTER — Other Ambulatory Visit: Payer: Self-pay | Admitting: Internal Medicine

## 2022-03-03 ENCOUNTER — Other Ambulatory Visit: Payer: Self-pay | Admitting: Internal Medicine

## 2022-03-03 DIAGNOSIS — I10 Essential (primary) hypertension: Secondary | ICD-10-CM

## 2022-03-20 ENCOUNTER — Other Ambulatory Visit: Payer: Self-pay | Admitting: Internal Medicine

## 2022-03-20 DIAGNOSIS — N529 Male erectile dysfunction, unspecified: Secondary | ICD-10-CM

## 2022-03-30 ENCOUNTER — Other Ambulatory Visit: Payer: Self-pay | Admitting: Urology

## 2022-03-30 DIAGNOSIS — R972 Elevated prostate specific antigen [PSA]: Secondary | ICD-10-CM

## 2022-04-26 ENCOUNTER — Other Ambulatory Visit: Payer: BC Managed Care – PPO

## 2022-05-23 ENCOUNTER — Other Ambulatory Visit: Payer: Self-pay | Admitting: Internal Medicine

## 2022-05-23 DIAGNOSIS — I10 Essential (primary) hypertension: Secondary | ICD-10-CM

## 2022-09-13 ENCOUNTER — Other Ambulatory Visit: Payer: Self-pay | Admitting: Internal Medicine

## 2022-09-13 DIAGNOSIS — N529 Male erectile dysfunction, unspecified: Secondary | ICD-10-CM

## 2022-10-12 ENCOUNTER — Other Ambulatory Visit (HOSPITAL_COMMUNITY): Payer: Self-pay | Admitting: Urology

## 2022-10-12 DIAGNOSIS — R972 Elevated prostate specific antigen [PSA]: Secondary | ICD-10-CM

## 2022-10-15 ENCOUNTER — Ambulatory Visit (HOSPITAL_COMMUNITY)
Admission: RE | Admit: 2022-10-15 | Discharge: 2022-10-15 | Disposition: A | Discharge status: Home / Self Care | Payer: 59 | Source: Ambulatory Visit | Attending: Urology | Admitting: Urology

## 2022-10-15 DIAGNOSIS — R972 Elevated prostate specific antigen [PSA]: Secondary | ICD-10-CM | POA: Diagnosis not present

## 2022-10-15 MED ORDER — GADOBUTROL 1 MMOL/ML IV SOLN
9.0000 mL | Freq: Once | INTRAVENOUS | Status: AC | PRN
  Administered 2022-10-15: 9 mL via INTRAVENOUS

## 2022-10-28 ENCOUNTER — Encounter (INDEPENDENT_AMBULATORY_CARE_PROVIDER_SITE_OTHER): Payer: Self-pay

## 2022-11-12 ENCOUNTER — Other Ambulatory Visit: Payer: Self-pay | Admitting: Internal Medicine

## 2022-11-12 DIAGNOSIS — N529 Male erectile dysfunction, unspecified: Secondary | ICD-10-CM

## 2022-11-17 ENCOUNTER — Encounter: Payer: Self-pay | Admitting: Internal Medicine

## 2022-11-18 ENCOUNTER — Other Ambulatory Visit: Payer: Self-pay | Admitting: Medical Genetics

## 2022-11-18 DIAGNOSIS — Z006 Encounter for examination for normal comparison and control in clinical research program: Secondary | ICD-10-CM

## 2022-11-19 ENCOUNTER — Other Ambulatory Visit: Payer: Self-pay | Admitting: Internal Medicine

## 2022-11-19 DIAGNOSIS — I10 Essential (primary) hypertension: Secondary | ICD-10-CM

## 2022-11-30 ENCOUNTER — Encounter: Payer: Self-pay | Admitting: Internal Medicine

## 2022-11-30 ENCOUNTER — Ambulatory Visit: Payer: 59 | Admitting: Internal Medicine

## 2022-11-30 VITALS — BP 130/80 | HR 62 | Temp 98.4°F | Ht 72.5 in | Wt 204.0 lb

## 2022-11-30 DIAGNOSIS — R972 Elevated prostate specific antigen [PSA]: Secondary | ICD-10-CM | POA: Diagnosis not present

## 2022-11-30 DIAGNOSIS — Z0000 Encounter for general adult medical examination without abnormal findings: Secondary | ICD-10-CM | POA: Diagnosis not present

## 2022-11-30 DIAGNOSIS — Z23 Encounter for immunization: Secondary | ICD-10-CM

## 2022-11-30 DIAGNOSIS — I10 Essential (primary) hypertension: Secondary | ICD-10-CM | POA: Diagnosis not present

## 2022-11-30 DIAGNOSIS — E785 Hyperlipidemia, unspecified: Secondary | ICD-10-CM | POA: Diagnosis not present

## 2022-11-30 DIAGNOSIS — E538 Deficiency of other specified B group vitamins: Secondary | ICD-10-CM

## 2022-11-30 DIAGNOSIS — N529 Male erectile dysfunction, unspecified: Secondary | ICD-10-CM

## 2022-11-30 LAB — COMPREHENSIVE METABOLIC PANEL
ALT: 30 U/L (ref 0–53)
AST: 26 U/L (ref 0–37)
Albumin: 4.3 g/dL (ref 3.5–5.2)
Alkaline Phosphatase: 65 U/L (ref 39–117)
BUN: 14 mg/dL (ref 6–23)
CO2: 29 meq/L (ref 19–32)
Calcium: 9.2 mg/dL (ref 8.4–10.5)
Chloride: 101 meq/L (ref 96–112)
Creatinine, Ser: 1.09 mg/dL (ref 0.40–1.50)
GFR: 68.68 mL/min (ref >60.00)
Glucose, Bld: 97 mg/dL (ref 70–99)
Potassium: 3.5 meq/L (ref 3.5–5.1)
Sodium: 139 meq/L (ref 135–145)
Total Bilirubin: 0.4 mg/dL (ref 0.2–1.2)
Total Protein: 6.6 g/dL (ref 6.0–8.3)

## 2022-11-30 LAB — CBC WITH DIFFERENTIAL/PLATELET
Basophils Absolute: 0.1 10*3/uL (ref 0.0–0.1)
Basophils Relative: 1.3 % (ref 0.0–3.0)
Eosinophils Absolute: 0.3 10*3/uL (ref 0.0–0.7)
Eosinophils Relative: 3.6 % (ref 0.0–5.0)
HCT: 43.6 % (ref 39.0–52.0)
Hemoglobin: 14.6 g/dL (ref 13.0–17.0)
Lymphocytes Relative: 17.5 % (ref 12.0–46.0)
Lymphs Abs: 1.5 10*3/uL (ref 0.7–4.0)
MCHC: 33.6 g/dL (ref 30.0–36.0)
MCV: 90.2 fl (ref 78.0–100.0)
Monocytes Absolute: 0.8 10*3/uL (ref 0.1–1.0)
Monocytes Relative: 9.8 % (ref 3.0–12.0)
Neutro Abs: 5.6 10*3/uL (ref 1.4–7.7)
Neutrophils Relative %: 67.8 % (ref 43.0–77.0)
Platelets: 260 10*3/uL (ref 150.0–400.0)
RBC: 4.83 Mil/uL (ref 4.22–5.81)
RDW: 13.3 % (ref 11.5–15.5)
WBC: 8.3 10*3/uL (ref 4.0–10.5)

## 2022-11-30 LAB — LIPID PANEL
Cholesterol: 136 mg/dL (ref 0–200)
HDL: 46 mg/dL (ref >39.00)
LDL Cholesterol: 75 mg/dL (ref 0–99)
NonHDL: 89.76
Total CHOL/HDL Ratio: 3
Triglycerides: 73 mg/dL (ref 0.0–149.0)
VLDL: 14.6 mg/dL (ref 0.0–40.0)

## 2022-11-30 LAB — VITAMIN B12: Vitamin B-12: 1151 pg/mL — ABNORMAL HIGH (ref 211–911)

## 2022-11-30 LAB — VITAMIN D 25 HYDROXY (VIT D DEFICIENCY, FRACTURES): VITD: 45.41 ng/mL (ref 30.00–100.00)

## 2022-11-30 LAB — TSH: TSH: 2.67 u[IU]/mL (ref 0.35–5.50)

## 2022-11-30 MED ORDER — TADALAFIL 5 MG PO TABS: 5.0000 mg | ORAL_TABLET | Freq: Every day | ORAL | 1 refills | Status: DC

## 2022-11-30 NOTE — Addendum Note (Signed)
Addended by: Kern Reap B on: 11/30/2022 10:19 AM   Modules accepted: Orders

## 2022-11-30 NOTE — Progress Notes (Signed)
Established Patient Office Visit     CC/Reason for Visit: Annual preventive exam, discuss chronic conditions  HPI: Jason Gould is a 72 y.o. male who is coming in today for the above mentioned reasons. Past Medical History is significant for: Hypertension, hyperlipidemia, erectile dysfunction.  Has routine eye and dental care.  Feeling well.  Is due for COVID, flu, pneumonia, RSV vaccines.  Had a colonoscopy in 2022.  In the last year he had a prostate biopsy due to elevated PSA without findings, will be following up in 6 months with urology.  Is requesting a change from sildenafil to tadalafil.  He has done some research that this could help with vascular dementia.   Past Medical/Surgical History: Past Medical History:  Diagnosis Date   Allergy    seasonal   Hx of colonic polyps    Hyperlipidemia    Hypertension     Past Surgical History:  Procedure Laterality Date   COLONOSCOPY     TONSILLECTOMY     VASECTOMY      Social History:  reports that he has never smoked. He has never used smokeless tobacco. He reports current alcohol use of about 2.0 standard drinks of alcohol per week. He reports that he does not use drugs.  Allergies: No Known Allergies  Family History:  Family History  Problem Relation Age of Onset   Hyperlipidemia Mother    Colon cancer Mother    Hypertension Father    Hyperlipidemia Father    Colon cancer Sister    Colon polyps Neg Hx    Esophageal cancer Neg Hx    Rectal cancer Neg Hx    Stomach cancer Neg Hx      Current Outpatient Medications:    atorvastatin (LIPITOR) 20 MG tablet, TAKE ONE TABLET BY MOUTH DAILY, Disp: 90 tablet, Rfl: 1   chlorthalidone (HYGROTON) 25 MG tablet, Take 1 tablet (25 mg total) by mouth daily., Disp: 90 tablet, Rfl: 1   losartan (COZAAR) 50 MG tablet, TAKE ONE TABLET BY MOUTH DAILY, Disp: 90 tablet, Rfl: 0   tadalafil (CIALIS) 5 MG tablet, Take 1 tablet (5 mg total) by mouth daily., Disp: 90 tablet, Rfl:  1   vitamin B-12 (CYANOCOBALAMIN) 500 MCG tablet, Take 500 mcg by mouth daily., Disp: , Rfl:    VITAMIN D PO, Take by mouth daily., Disp: , Rfl:   Review of Systems:  Negative unless indicated in HPI.   Physical Exam: Vitals:   11/30/22 0936  BP: 130/80  Pulse: 62  Temp: 98.4 F (36.9 C)  TempSrc: Oral  SpO2: 98%  Weight: 204 lb (92.5 kg)  Height: 6' 0.5" (1.842 m)    Body mass index is 27.29 kg/m.   Physical Exam Vitals reviewed.  Constitutional:      General: He is not in acute distress.    Appearance: Normal appearance. He is not ill-appearing, toxic-appearing or diaphoretic.  HENT:     Head: Normocephalic.     Right Ear: Tympanic membrane, ear canal and external ear normal. There is no impacted cerumen.     Left Ear: Tympanic membrane, ear canal and external ear normal. There is no impacted cerumen.     Nose: Nose normal.     Mouth/Throat:     Mouth: Mucous membranes are moist.     Pharynx: Oropharynx is clear. No oropharyngeal exudate or posterior oropharyngeal erythema.  Eyes:     General: No scleral icterus.       Right  eye: No discharge.        Left eye: No discharge.     Conjunctiva/sclera: Conjunctivae normal.     Pupils: Pupils are equal, round, and reactive to light.  Neck:     Vascular: No carotid bruit.  Cardiovascular:     Rate and Rhythm: Normal rate and regular rhythm.     Pulses: Normal pulses.     Heart sounds: Normal heart sounds.  Pulmonary:     Effort: Pulmonary effort is normal. No respiratory distress.     Breath sounds: Normal breath sounds.  Abdominal:     General: Abdomen is flat. Bowel sounds are normal.     Palpations: Abdomen is soft.  Musculoskeletal:        General: Normal range of motion.     Cervical back: Normal range of motion.  Skin:    General: Skin is warm and dry.  Neurological:     General: No focal deficit present.     Mental Status: He is alert and oriented to person, place, and time. Mental status is at  baseline.  Psychiatric:        Mood and Affect: Mood normal.        Behavior: Behavior normal.        Thought Content: Thought content normal.        Judgment: Judgment normal.          01/24/2019   10:06 AM 07/08/2021    9:27 AM 11/30/2022    9:34 AM  Fall Risk  Falls in the past year? 0 0 0  Was there an injury with Fall? 0 0 0  Fall Risk Category Calculator 0 0 0  Fall Risk Category (Retired) Low Low   (RETIRED) Patient Fall Risk Level  Low fall risk   Patient at Risk for Falls Due to  No Fall Risks   Fall risk Follow up  Falls evaluation completed Falls evaluation completed     Impression and Plan:  Encounter for preventive health examination  Elevated PSA  Essential hypertension, benign -     CBC with Differential/Platelet; Future -     Comprehensive metabolic panel; Future -     TSH; Future -     VITAMIN D 25 Hydroxy (Vit-D Deficiency, Fractures); Future  Hyperlipidemia, unspecified hyperlipidemia type -     Lipid panel; Future  Vitamin B12 deficiency -     Vitamin B12; Future  Immunization due  Erectile dysfunction, unspecified erectile dysfunction type -     Tadalafil; Take 1 tablet (5 mg total) by mouth daily.  Dispense: 90 tablet; Refill: 1   -Recommend routine eye and dental care. -Healthy lifestyle discussed in detail. -Labs to be updated today. -Prostate cancer screening: followed by urology Health Maintenance  Topic Date Due   Pneumonia Vaccine (2 of 2 - PCV) 11/13/2018   COVID-19 Vaccine (4 - 2023-24 season) 12/16/2022*   Flu Shot  06/13/2023*   Colon Cancer Screening  06/25/2025   DTaP/Tdap/Td vaccine (4 - Td or Tdap) 02/03/2028   Hepatitis C Screening  Completed   Zoster (Shingles) Vaccine  Completed   HPV Vaccine  Aged Out  *Topic was postponed. The date shown is not the original due date.     -PCV 20 in office today.  Advised to update flu, COVID, RSV at pharmacy. -Change sildenafil to tadalafil daily per request.     Chaya Jan, MD [facility] at [facility]

## 2022-12-27 ENCOUNTER — Other Ambulatory Visit: Payer: Self-pay | Admitting: Internal Medicine

## 2022-12-27 DIAGNOSIS — I10 Essential (primary) hypertension: Secondary | ICD-10-CM

## 2023-02-16 ENCOUNTER — Other Ambulatory Visit: Payer: Self-pay | Admitting: Internal Medicine

## 2023-02-16 DIAGNOSIS — I10 Essential (primary) hypertension: Secondary | ICD-10-CM

## 2023-04-18 ENCOUNTER — Telehealth: Payer: Self-pay

## 2023-04-18 NOTE — Telephone Encounter (Signed)
Copied from CRM [id number]. Topic: General - Other >> Apr 18, 2023  4:26 PM Jon Gills C wrote: Reason for CRM: Patient called in wants to be sent his whole list of medication at [phone]

## 2023-04-19 NOTE — Telephone Encounter (Signed)
List faxed and confirmed.

## 2023-04-21 ENCOUNTER — Other Ambulatory Visit: Payer: Self-pay | Admitting: Internal Medicine

## 2023-06-15 DIAGNOSIS — R972 Elevated prostate specific antigen [PSA]: Secondary | ICD-10-CM | POA: Diagnosis not present

## 2023-06-22 DIAGNOSIS — R972 Elevated prostate specific antigen [PSA]: Secondary | ICD-10-CM | POA: Diagnosis not present

## 2023-06-22 DIAGNOSIS — N400 Enlarged prostate without lower urinary tract symptoms: Secondary | ICD-10-CM | POA: Diagnosis not present

## 2023-07-25 DIAGNOSIS — E785 Hyperlipidemia, unspecified: Secondary | ICD-10-CM | POA: Diagnosis not present

## 2023-07-25 DIAGNOSIS — I10 Essential (primary) hypertension: Secondary | ICD-10-CM | POA: Diagnosis not present

## 2023-07-25 DIAGNOSIS — E559 Vitamin D deficiency, unspecified: Secondary | ICD-10-CM | POA: Diagnosis not present

## 2023-07-25 DIAGNOSIS — E538 Deficiency of other specified B group vitamins: Secondary | ICD-10-CM | POA: Diagnosis not present

## 2023-08-08 ENCOUNTER — Other Ambulatory Visit: Payer: Self-pay | Admitting: Internal Medicine

## 2023-08-08 DIAGNOSIS — I10 Essential (primary) hypertension: Secondary | ICD-10-CM

## 2023-08-25 DIAGNOSIS — L814 Other melanin hyperpigmentation: Secondary | ICD-10-CM | POA: Diagnosis not present

## 2023-08-25 DIAGNOSIS — L821 Other seborrheic keratosis: Secondary | ICD-10-CM | POA: Diagnosis not present

## 2023-08-25 DIAGNOSIS — D229 Melanocytic nevi, unspecified: Secondary | ICD-10-CM | POA: Diagnosis not present

## 2023-08-25 DIAGNOSIS — B880 Other acariasis: Secondary | ICD-10-CM | POA: Diagnosis not present

## 2023-09-02 ENCOUNTER — Other Ambulatory Visit: Payer: Self-pay | Admitting: Internal Medicine

## 2023-09-02 DIAGNOSIS — I10 Essential (primary) hypertension: Secondary | ICD-10-CM

## 2023-09-22 DIAGNOSIS — H35361 Drusen (degenerative) of macula, right eye: Secondary | ICD-10-CM | POA: Diagnosis not present

## 2023-09-22 DIAGNOSIS — H35033 Hypertensive retinopathy, bilateral: Secondary | ICD-10-CM | POA: Diagnosis not present

## 2023-09-22 DIAGNOSIS — H2513 Age-related nuclear cataract, bilateral: Secondary | ICD-10-CM | POA: Diagnosis not present

## 2023-09-22 DIAGNOSIS — H35431 Paving stone degeneration of retina, right eye: Secondary | ICD-10-CM | POA: Diagnosis not present

## 2023-10-07 ENCOUNTER — Other Ambulatory Visit: Payer: Self-pay | Admitting: Internal Medicine

## 2023-10-07 DIAGNOSIS — N529 Male erectile dysfunction, unspecified: Secondary | ICD-10-CM

## 2023-11-02 ENCOUNTER — Other Ambulatory Visit: Payer: Self-pay | Admitting: Internal Medicine

## 2023-11-30 ENCOUNTER — Other Ambulatory Visit: Payer: Self-pay | Admitting: Internal Medicine

## 2023-11-30 DIAGNOSIS — I10 Essential (primary) hypertension: Secondary | ICD-10-CM

## 2023-12-01 ENCOUNTER — Encounter: Payer: 59 | Admitting: Internal Medicine

## 2024-01-09 ENCOUNTER — Other Ambulatory Visit: Payer: Self-pay | Admitting: Medical Genetics

## 2024-01-09 DIAGNOSIS — Z006 Encounter for examination for normal comparison and control in clinical research program: Secondary | ICD-10-CM

## 2024-02-02 DIAGNOSIS — I10 Essential (primary) hypertension: Secondary | ICD-10-CM | POA: Diagnosis not present
